# Patient Record
Sex: Female | Born: 1955 | Race: White | Hispanic: No | Smoking: Current every day smoker
Health system: Southern US, Community
[De-identification: ages and names within clinical notes are randomized; demographics above are authoritative.]

## PROBLEM LIST (undated history)

## (undated) DIAGNOSIS — G43909 Migraine, unspecified, not intractable, without status migrainosus: Secondary | ICD-10-CM

## (undated) DIAGNOSIS — I209 Angina pectoris, unspecified: Secondary | ICD-10-CM

## (undated) DIAGNOSIS — F32A Depression, unspecified: Secondary | ICD-10-CM

## (undated) DIAGNOSIS — F419 Anxiety disorder, unspecified: Secondary | ICD-10-CM

## (undated) DIAGNOSIS — E7800 Pure hypercholesterolemia, unspecified: Secondary | ICD-10-CM

## (undated) DIAGNOSIS — I10 Essential (primary) hypertension: Secondary | ICD-10-CM

## (undated) DIAGNOSIS — F329 Major depressive disorder, single episode, unspecified: Secondary | ICD-10-CM

## (undated) DIAGNOSIS — K219 Gastro-esophageal reflux disease without esophagitis: Secondary | ICD-10-CM

## (undated) DIAGNOSIS — F319 Bipolar disorder, unspecified: Secondary | ICD-10-CM

## (undated) HISTORY — PX: BACK SURGERY: SHX140

---

## 1989-01-22 HISTORY — PX: ANTERIOR CERVICAL DECOMP/DISCECTOMY FUSION: SHX1161

## 1990-05-24 HISTORY — PX: BUNIONECTOMY: SHX129

## 2001-04-19 ENCOUNTER — Other Ambulatory Visit: Admission: RE | Admit: 2001-04-19 | Discharge: 2001-04-19 | Payer: Self-pay | Admitting: Gynecology

## 2002-04-05 ENCOUNTER — Other Ambulatory Visit: Admission: RE | Admit: 2002-04-05 | Discharge: 2002-04-05 | Payer: Self-pay | Admitting: Gynecology

## 2003-04-22 ENCOUNTER — Other Ambulatory Visit: Admission: RE | Admit: 2003-04-22 | Discharge: 2003-04-22 | Payer: Self-pay | Admitting: Gynecology

## 2004-03-26 ENCOUNTER — Other Ambulatory Visit: Admission: RE | Admit: 2004-03-26 | Discharge: 2004-03-26 | Payer: Self-pay | Admitting: Gynecology

## 2005-05-20 ENCOUNTER — Other Ambulatory Visit: Admission: RE | Admit: 2005-05-20 | Discharge: 2005-05-20 | Payer: Self-pay | Admitting: Gynecology

## 2007-10-26 ENCOUNTER — Observation Stay (HOSPITAL_COMMUNITY): Admission: EM | Admit: 2007-10-26 | Discharge: 2007-10-27 | Payer: Self-pay | Admitting: Emergency Medicine

## 2007-11-07 HISTORY — PX: CARDIAC CATHETERIZATION: SHX172

## 2008-02-13 ENCOUNTER — Other Ambulatory Visit: Admission: RE | Admit: 2008-02-13 | Discharge: 2008-02-13 | Payer: Self-pay | Admitting: Gynecology

## 2008-04-08 ENCOUNTER — Encounter: Admission: RE | Admit: 2008-04-08 | Discharge: 2008-04-08 | Payer: Self-pay | Admitting: Family Medicine

## 2010-10-06 NOTE — H&P (Signed)
[name], [name] NO.:  [id number]   MEDICAL RECORD NO.:  [id number]          PATIENT TYPE:  EMS   LOCATION:  MAJO                         FACILITY:  [facility]   PHYSICIAN:  Elmore Guise., M.D.DATE OF BIRTH:  04-13-1956   DATE OF ADMISSION:  10/26/2007  DATE OF DISCHARGE:                              HISTORY & PHYSICAL   INDICATION FOR ADMISSION:  Chest pain.   PRIMARY CARE PHYSICIAN:  Dr. Ursula Beath, [facility].   HISTORY OF PRESENT ILLNESS:  [name] is a very pleasant 55 year old  white female with past medical history of tobacco use, strong family  history of early heart disease who presents with a 24-hour history of  off-and-on substernal chest pain.  Her initial symptoms started last  p.m. with severe substernal chest pressure. This was associated with  shortness of breath.  She does state it was worse with exertion.  She  denies any nausea or diaphoresis.  She did say she felt cold and clammy  though.  She got up, tried to walk around, and her symptoms worsened.  She laid down because her symptoms were so bad.  This lasted 5 minutes  and then slowly improved.  She then went to bed, awoke this morning with  recurrence of some chest tightness with exertion.  This was again  associated with shortness of breath.  She had no nausea or diaphoresis.  She has had decreased exertional tolerance the last couple of months. No  fever or cough, orthopnea, PND.  No nausea, vomiting, diarrhea. Her  weight is stable for her.  She has had no palpitations, pre-syncope or  syncope.   REVIEW OF SYSTEMS:  As per HPI or otherwise negative.   MEDICATIONS:  1. Celexa.  2. Aspirin.  3. Zyrtec.   ALLERGIES:  GUAIFENESIN AND PENICILLIN.   FAMILY HISTORY:  Is positive for heart disease with a brother having  stents  or a heart attack at age 25.   SOCIAL HISTORY:  She is married.  Smokes one pack per day.  Occasional  alcohol intake.  She  is afebrile.  Temperature is 98.3, blood pressure  140/80, heart rate 71, respirations are 20, O2 saturation is 98% on room  air.  GENERAL:  In general, she is a very pleasant white female, alert and  oriented x4.  No acute distress.  HEENT:  Is normal.  NECK: Supple.  No lymphadenopathy, 2+ carotids.  No JVD and no bruits.  LUNGS:  Clear.  HEART:  Heart is regular with no significant murmurs, gallops or rubs.  ABDOMEN: Soft, nontender, nontender, nondistended with no rebound or  guarding.  EXTREMITIES:  Warm with 2+ pulses and no significant edema.   Her blood work shows a BUN and creatinine of 10 and 0.9, potassium level  3.7, H&H 14.6  and 43.0. Her cardiac markers are negative with a  troponin less than 0.05, MB of less than 1.0.  Myoglobin of 24.  Her ECG  shows normal sinus rhythm with septal Q-waves, but no significant ST or  T-wave changes.  CT  of the chest showed mild aneurysmal dilatation of  the ascending aorta, otherwise normal.  Chest x-ray showed no pulmonary  edema or pneumonia. Cardiac silhouette showed very mild cardiomegaly.   IMPRESSION:  1. Chest pain.  2. Tobacco dependence.  3. Strong family history of heart disease.   PLAN:  At this time we will continue aspirin.  She is chest pain free  currently.  We will start beta blocker, metoprolol 12.5 mg twice daily  with p.r.n. nitroglycerin.  She will have serial cardiac markers as well  as fasting lipid panel.  She will have cardiac catheterization in the  morning.  This was discussed with her at length.      Elmore Guise., M.D.  Electronically Signed     TWK/MEDQ  D:  10/26/2007  T:  10/26/2007  Job:  [id number]

## 2010-10-09 NOTE — Discharge Summary (Signed)
[name], [name] NO.:  [id number]   MEDICAL RECORD NO.:  [id number]          PATIENT TYPE:  INP   LOCATION:  4729                         FACILITY:  [facility]   PHYSICIAN:  Elmore Guise., M.D.DATE OF BIRTH:  July 14, 1955   DATE OF ADMISSION:  10/26/2007  DATE OF DISCHARGE:  10/27/2007                               DISCHARGE SUMMARY   DISCHARGE DIAGNOSES:  1. Normal-appearing coronary arteries.  2. Tobacco dependence.  3. Seasonal allergies.  4. Anxiety/depression.   HISTORY OF PRESENT ILLNESS:  [name] is a very pleasant 55 year old  white female who presented with increasing exertional chest pain and  shortness of breath.  She was admitted for observation and follow up.  She was referred for cardiac catheterization because of her symptoms and  strong family history of early heart disease.   HOSPITAL COURSE:  The patient's hospital course was uncomplicated.  She  underwent cardiac catheterization on October 27, 2007.  She tolerated the  procedure well.  She had Angio-Seal closure device deployed without  difficulty.  Her postprocedure course was uncomplicated.  Her cast  showed that she had normal-appearing coronary arteries.  She has now  been up and ambulatory without any significant problems.  She will be  discharged home today to continue the following medications.  1. Celexa 40 mg daily.  2. Zyrtec 10 mg daily.  3. Fish oil once daily.  4. Vitamin C and E once daily.  5. Milk thistle once daily.  All as before.   She will have post cath restrictions including no heavy lifting or  strenuous activity for the next couple of days.  She is to increase her  activity slowly.  She is to call the office if she has any significant  swelling, drainage, warmth, or bleeding from her cath site.  Her  restrictions were discussed in detail.  I did discuss complete tobacco  cessation, and she understands the importance of this.  Her follow up  appointment will be  with Dr. Lady Deutscher at [facility] in 2  weeks.      Elmore Guise., M.D.  Electronically Signed     TWK/MEDQ  D:  10/30/2007  T:  10/31/2007  Job:  [id number]

## 2011-02-18 LAB — CARDIAC PANEL(CRET KIN+CKTOT+MB+TROPI)
CK, MB: 1.1
Total CK: 58
Troponin I: 0.02

## 2011-02-18 LAB — CBC
HCT: 41.4
HCT: 42
Hemoglobin: 14.1
MCHC: 34
MCHC: 35.1
MCV: 96.3
Platelets: 184
RBC: 4.25
WBC: 5.8
WBC: 6.2

## 2011-02-18 LAB — PROTIME-INR: INR: 0.9

## 2011-02-18 LAB — LIPID PANEL
Cholesterol: 184
HDL: 67
LDL Cholesterol: 107 — ABNORMAL HIGH
Triglycerides: 49

## 2011-02-18 LAB — DIFFERENTIAL
Basophils Absolute: 0
Basophils Relative: 1
Eosinophils Absolute: 0.2
Eosinophils Relative: 4
Lymphocytes Relative: 43
Lymphs Abs: 2.5
Monocytes Absolute: 0.7

## 2011-02-18 LAB — POCT I-STAT, CHEM 8
Calcium, Ion: 1.25
Hemoglobin: 14.6
TCO2: 28

## 2011-02-18 LAB — CK TOTAL AND CKMB (NOT AT ARMC)
CK, MB: 1.5
Total CK: 78

## 2011-02-18 LAB — POCT CARDIAC MARKERS: CKMB, poc: <1 — ABNORMAL LOW

## 2011-02-18 LAB — BASIC METABOLIC PANEL
Glucose, Bld: 99
Potassium: 3.7

## 2011-02-18 LAB — APTT: aPTT: 25

## 2011-04-14 ENCOUNTER — Other Ambulatory Visit: Payer: Self-pay | Admitting: Gynecology

## 2011-04-14 DIAGNOSIS — R928 Other abnormal and inconclusive findings on diagnostic imaging of breast: Secondary | ICD-10-CM

## 2011-04-19 ENCOUNTER — Ambulatory Visit
Admission: RE | Admit: 2011-04-19 | Discharge: 2011-04-19 | Disposition: A | Discharge status: Home / Self Care | Payer: 59 | Source: Ambulatory Visit | Attending: Gynecology | Admitting: Gynecology

## 2011-04-19 ENCOUNTER — Other Ambulatory Visit: Payer: Self-pay | Admitting: Gynecology

## 2011-04-19 DIAGNOSIS — R928 Other abnormal and inconclusive findings on diagnostic imaging of breast: Secondary | ICD-10-CM

## 2011-04-22 ENCOUNTER — Ambulatory Visit
Admission: RE | Admit: 2011-04-22 | Discharge: 2011-04-22 | Disposition: A | Discharge status: Home / Self Care | Payer: 59 | Source: Ambulatory Visit | Attending: Gynecology | Admitting: Gynecology

## 2011-04-22 ENCOUNTER — Other Ambulatory Visit: Payer: Self-pay | Admitting: Gynecology

## 2011-04-22 DIAGNOSIS — R928 Other abnormal and inconclusive findings on diagnostic imaging of breast: Secondary | ICD-10-CM

## 2011-07-01 ENCOUNTER — Ambulatory Visit (INDEPENDENT_AMBULATORY_CARE_PROVIDER_SITE_OTHER): Payer: 59 | Admitting: Licensed Clinical Social Worker

## 2011-07-01 DIAGNOSIS — F331 Major depressive disorder, recurrent, moderate: Secondary | ICD-10-CM

## 2011-07-01 DIAGNOSIS — F411 Generalized anxiety disorder: Secondary | ICD-10-CM

## 2011-07-08 ENCOUNTER — Ambulatory Visit (INDEPENDENT_AMBULATORY_CARE_PROVIDER_SITE_OTHER): Payer: 59 | Admitting: Licensed Clinical Social Worker

## 2011-07-08 DIAGNOSIS — F411 Generalized anxiety disorder: Secondary | ICD-10-CM

## 2011-07-08 DIAGNOSIS — F331 Major depressive disorder, recurrent, moderate: Secondary | ICD-10-CM

## 2011-07-15 ENCOUNTER — Ambulatory Visit (INDEPENDENT_AMBULATORY_CARE_PROVIDER_SITE_OTHER): Payer: 59 | Admitting: Licensed Clinical Social Worker

## 2011-07-15 DIAGNOSIS — F331 Major depressive disorder, recurrent, moderate: Secondary | ICD-10-CM

## 2011-07-15 DIAGNOSIS — F411 Generalized anxiety disorder: Secondary | ICD-10-CM

## 2011-07-23 ENCOUNTER — Ambulatory Visit: Payer: 59 | Admitting: Licensed Clinical Social Worker

## 2011-07-28 ENCOUNTER — Ambulatory Visit (INDEPENDENT_AMBULATORY_CARE_PROVIDER_SITE_OTHER): Payer: 59 | Admitting: Licensed Clinical Social Worker

## 2011-07-28 DIAGNOSIS — F331 Major depressive disorder, recurrent, moderate: Secondary | ICD-10-CM

## 2011-07-28 DIAGNOSIS — F411 Generalized anxiety disorder: Secondary | ICD-10-CM

## 2011-08-06 ENCOUNTER — Ambulatory Visit (INDEPENDENT_AMBULATORY_CARE_PROVIDER_SITE_OTHER): Payer: 59 | Admitting: Licensed Clinical Social Worker

## 2011-08-06 DIAGNOSIS — F411 Generalized anxiety disorder: Secondary | ICD-10-CM

## 2011-08-06 DIAGNOSIS — F331 Major depressive disorder, recurrent, moderate: Secondary | ICD-10-CM

## 2011-08-13 ENCOUNTER — Ambulatory Visit (INDEPENDENT_AMBULATORY_CARE_PROVIDER_SITE_OTHER): Payer: 59 | Admitting: Licensed Clinical Social Worker

## 2011-08-13 DIAGNOSIS — F411 Generalized anxiety disorder: Secondary | ICD-10-CM

## 2011-08-13 DIAGNOSIS — F331 Major depressive disorder, recurrent, moderate: Secondary | ICD-10-CM

## 2011-08-19 ENCOUNTER — Ambulatory Visit: Payer: 59 | Admitting: Licensed Clinical Social Worker

## 2011-08-26 ENCOUNTER — Ambulatory Visit (INDEPENDENT_AMBULATORY_CARE_PROVIDER_SITE_OTHER): Payer: 59 | Admitting: Licensed Clinical Social Worker

## 2011-08-26 DIAGNOSIS — F411 Generalized anxiety disorder: Secondary | ICD-10-CM

## 2011-08-26 DIAGNOSIS — F331 Major depressive disorder, recurrent, moderate: Secondary | ICD-10-CM

## 2011-09-09 ENCOUNTER — Ambulatory Visit (INDEPENDENT_AMBULATORY_CARE_PROVIDER_SITE_OTHER): Payer: 59 | Admitting: Licensed Clinical Social Worker

## 2011-09-09 DIAGNOSIS — F331 Major depressive disorder, recurrent, moderate: Secondary | ICD-10-CM

## 2011-09-09 DIAGNOSIS — F411 Generalized anxiety disorder: Secondary | ICD-10-CM

## 2011-09-16 ENCOUNTER — Ambulatory Visit (INDEPENDENT_AMBULATORY_CARE_PROVIDER_SITE_OTHER): Payer: 59 | Admitting: Licensed Clinical Social Worker

## 2011-09-16 DIAGNOSIS — F331 Major depressive disorder, recurrent, moderate: Secondary | ICD-10-CM

## 2011-09-16 DIAGNOSIS — F411 Generalized anxiety disorder: Secondary | ICD-10-CM

## 2011-09-17 ENCOUNTER — Ambulatory Visit: Payer: 59 | Admitting: Licensed Clinical Social Worker

## 2011-09-24 ENCOUNTER — Ambulatory Visit (INDEPENDENT_AMBULATORY_CARE_PROVIDER_SITE_OTHER): Payer: 59 | Admitting: Licensed Clinical Social Worker

## 2011-09-24 DIAGNOSIS — F411 Generalized anxiety disorder: Secondary | ICD-10-CM

## 2011-09-24 DIAGNOSIS — F331 Major depressive disorder, recurrent, moderate: Secondary | ICD-10-CM

## 2011-10-01 ENCOUNTER — Ambulatory Visit (INDEPENDENT_AMBULATORY_CARE_PROVIDER_SITE_OTHER): Payer: 59 | Admitting: Licensed Clinical Social Worker

## 2011-10-01 DIAGNOSIS — F331 Major depressive disorder, recurrent, moderate: Secondary | ICD-10-CM

## 2011-10-01 DIAGNOSIS — F411 Generalized anxiety disorder: Secondary | ICD-10-CM

## 2011-10-08 ENCOUNTER — Ambulatory Visit: Payer: 59 | Admitting: Licensed Clinical Social Worker

## 2011-10-29 ENCOUNTER — Ambulatory Visit: Payer: 59 | Admitting: Licensed Clinical Social Worker

## 2011-11-24 ENCOUNTER — Ambulatory Visit (INDEPENDENT_AMBULATORY_CARE_PROVIDER_SITE_OTHER): Payer: 59 | Admitting: Licensed Clinical Social Worker

## 2011-11-24 DIAGNOSIS — F411 Generalized anxiety disorder: Secondary | ICD-10-CM

## 2011-11-24 DIAGNOSIS — F331 Major depressive disorder, recurrent, moderate: Secondary | ICD-10-CM

## 2011-12-08 ENCOUNTER — Ambulatory Visit (INDEPENDENT_AMBULATORY_CARE_PROVIDER_SITE_OTHER): Payer: 59 | Admitting: Licensed Clinical Social Worker

## 2011-12-08 DIAGNOSIS — F411 Generalized anxiety disorder: Secondary | ICD-10-CM

## 2011-12-08 DIAGNOSIS — F331 Major depressive disorder, recurrent, moderate: Secondary | ICD-10-CM

## 2011-12-22 ENCOUNTER — Ambulatory Visit (INDEPENDENT_AMBULATORY_CARE_PROVIDER_SITE_OTHER): Payer: 59 | Admitting: Licensed Clinical Social Worker

## 2011-12-22 DIAGNOSIS — F331 Major depressive disorder, recurrent, moderate: Secondary | ICD-10-CM

## 2011-12-22 DIAGNOSIS — F411 Generalized anxiety disorder: Secondary | ICD-10-CM

## 2011-12-29 ENCOUNTER — Ambulatory Visit (INDEPENDENT_AMBULATORY_CARE_PROVIDER_SITE_OTHER): Payer: 59 | Admitting: Licensed Clinical Social Worker

## 2011-12-29 DIAGNOSIS — F331 Major depressive disorder, recurrent, moderate: Secondary | ICD-10-CM

## 2011-12-29 DIAGNOSIS — F411 Generalized anxiety disorder: Secondary | ICD-10-CM

## 2012-01-11 ENCOUNTER — Emergency Department (HOSPITAL_COMMUNITY)
Admission: EM | Admit: 2012-01-11 | Discharge: 2012-01-12 | Disposition: A | Discharge status: Home / Self Care | Payer: 59 | Attending: Emergency Medicine | Admitting: Emergency Medicine

## 2012-01-11 ENCOUNTER — Encounter (HOSPITAL_COMMUNITY): Payer: Self-pay | Admitting: Emergency Medicine

## 2012-01-11 DIAGNOSIS — F17200 Nicotine dependence, unspecified, uncomplicated: Secondary | ICD-10-CM | POA: Insufficient documentation

## 2012-01-11 DIAGNOSIS — I10 Essential (primary) hypertension: Secondary | ICD-10-CM | POA: Insufficient documentation

## 2012-01-11 DIAGNOSIS — R55 Syncope and collapse: Secondary | ICD-10-CM | POA: Insufficient documentation

## 2012-01-11 HISTORY — DX: Essential (primary) hypertension: I10

## 2012-01-11 HISTORY — DX: Depression, unspecified: F32.A

## 2012-01-11 HISTORY — DX: Major depressive disorder, single episode, unspecified: F32.9

## 2012-01-11 MED ORDER — SODIUM CHLORIDE 0.9 % IV SOLN
Freq: Once | INTRAVENOUS | Status: AC
  Administered 2012-01-11: via INTRAVENOUS

## 2012-01-11 NOTE — ED Notes (Addendum)
Patient states "I just don't feel well. I feel like I am going to pass out. I took my blood pressure at home and it was 87/63." Admits to drinking 3 beers today. Patient states she is on a new medication for depression.

## 2012-01-11 NOTE — ED Notes (Addendum)
Pt reporting that previously tonight she became lightheaded and dizzy.  Denies actually losing consciousness, but felt like she could pass out.  Pt reporting that she began taking Lamictal a few days ago.  Pt also reporting drinking "a couple" beers today.

## 2012-01-11 NOTE — ED Provider Notes (Signed)
History  This chart was scribed for [employer]. Colon Branch, MD by Bennett Scrape. This patient was seen in room APA06/APA06 and the patient's care was started at 10:57PM.  CSN: [id number]  Arrival date & time 01/11/12  2123   First MD Initiated Contact with Patient 01/11/12 2257      Chief Complaint  Patient presents with  . Dizziness    The history is provided by the patient. No language interpreter was used.    [name] is a 56 y.o. female who presents to the Emergency Department complaining of one episode of near syncope with associated dizziness that occurred approximately 2 hours ago. She states that she still feels dizzy currently. The dizziness is worse with sitting and standing. She denies any recent illnesses but reports that her PCP recently started her on 25 mg of Lamictal and cut her Celexa prescription from 40 mg to 20 mg per day. She states that she has felt "itchy" since starting the Xanax. She denies having prior episodes of similar symptoms. She also c/o HA but denies fever, sore throat, visual disturbance, CP, SOB, abdominal pain, urinary symptoms, back pain, weakness and rash as associated symptoms. She reports that she has been eating and drinking normally.  Dr. Arlyce Dice at [facility] is her PCP. Dr. Clois Comber is her psychiatrist   Past Medical History  Diagnosis Date  . Depression   . Hypertension     Past Surgical History  Procedure Date  . Cervical discectomy   . Bunion removal     No family history on file.  History  Substance Use Topics  . Smoking status: Current Everyday Smoker  . Smokeless tobacco: Not on file  . Alcohol Use: Yes     daily    No OB history provided.  Review of Systems  Constitutional: Negative for fever.       10 Systems reviewed and are negative for acute change except as noted in the HPI.  HENT: Negative for congestion.   Eyes: Negative for discharge and redness.  Respiratory: Negative for cough and shortness of breath.     Cardiovascular: Negative for chest pain.  Gastrointestinal: Negative for vomiting and abdominal pain.  Musculoskeletal: Negative for back pain.  Skin: Negative for rash.  Neurological: Positive for light-headedness. Negative for syncope, numbness and headaches.  Psychiatric/Behavioral:       No behavior change.      Allergies  Guaifenesin & derivatives  Home Medications   Current Outpatient Rx  Name Route Sig Dispense Refill  . ALPRAZOLAM 0.5 MG PO TABS Oral Take 0.5 mg by mouth at bedtime. PRESCRIBED: take one-half to one tablet by mouth three times daily as needed    . CITALOPRAM HYDROBROMIDE 40 MG PO TABS Oral Take 40 mg by mouth daily.    . OMEGA-3 FATTY ACIDS 1000 MG PO CAPS Oral Take 1 g by mouth daily.    Marland Kitchen LAMOTRIGINE 25 MG PO TABS Oral Take 25 mg by mouth as directed. Take one tablet by mouth at bedtime for 2 weeks, then take two tablets at bedtime thereafter    . OLMESARTAN MEDOXOMIL-HCTZ 20-12.5 MG PO TABS Oral Take 1 tablet by mouth daily.      Triage Vitals: BP 117/71  Pulse 92  Temp 98.3 F (36.8 C) (Oral)  Resp 16  Ht 5\' 3"  (1.6 m)  Wt 114 lb (51.71 kg)  BMI 20.19 kg/m2  SpO2 97%  Physical Exam  Nursing note and vitals reviewed. Constitutional: She is oriented  to person, place, and time. She appears well-developed and well-nourished. No distress.  HENT:  Head: Normocephalic and atraumatic.       Subjective dizziness with sitting up  Eyes: Conjunctivae and EOM are normal.  Neck: Neck supple. No tracheal deviation present.  Cardiovascular: Normal rate and regular rhythm.  Exam reveals no gallop and no friction rub.   No murmur heard. Pulmonary/Chest: Effort normal and breath sounds normal. No respiratory distress. She has no wheezes. She has no rales.  Abdominal: Soft. Bowel sounds are normal. There is no tenderness.  Musculoskeletal: Normal range of motion. She exhibits no edema.  Neurological: She is alert and oriented to person, place, and time.   Skin: Skin is warm and dry.  Psychiatric: She has a normal mood and affect. Her behavior is normal.    ED Course  Procedures (including critical care time)  DIAGNOSTIC STUDIES: Oxygen Saturation is 97% on room air, adequate by my interpretation.    COORDINATION OF CARE: 11:30PM-Discussed treatment plan which includes IV fluids and blood work with pt at bedside and pt agreed to plan.  MDM   Patient with near syncopal episode with position change. Orthostatic VS normal. Given IVF, ambulated in the hallway. No further sensation of light headedness.Pt stable in ED with no significant deterioration in condition.The patient appears reasonably screened and/or stabilized for discharge and I doubt any other medical condition or other [facility] requiring further screening, evaluation, or treatment in the ED at this time prior to discharge.  I personally performed the services described in this documentation, which was scribed in my presence. The recorded information has been reviewed and considered.   MDM Reviewed: nursing note and vitals          Nicoletta Dress. Colon Branch, MD 01/12/12 1610

## 2012-01-12 ENCOUNTER — Other Ambulatory Visit: Payer: Self-pay | Admitting: Physician Assistant

## 2012-01-12 LAB — BASIC METABOLIC PANEL
CO2: 29 mEq/L (ref 19–32)
Chloride: 99 mEq/L (ref 96–112)
Glucose, Bld: 112 mg/dL — ABNORMAL HIGH (ref 70–99)
Potassium: 3.6 mEq/L (ref 3.5–5.1)
Sodium: 139 mEq/L (ref 135–145)

## 2012-01-12 MED ORDER — IBUPROFEN 400 MG PO TABS
600.0000 mg | ORAL_TABLET | Freq: Once | ORAL | Status: AC
  Administered 2012-01-12: 600 mg via ORAL
  Filled 2012-01-12: qty 2

## 2012-01-12 MED ORDER — SODIUM CHLORIDE 0.9 % IV BOLUS (SEPSIS): 1000.0000 mL | Freq: Once | INTRAVENOUS | Status: DC

## 2012-01-12 NOTE — ED Notes (Signed)
Pt reporting relief of headache.  Ambulated around nurses' station with no difficulty, dizziness or SOB.

## 2012-01-13 ENCOUNTER — Ambulatory Visit
Admission: RE | Admit: 2012-01-13 | Discharge: 2012-01-13 | Disposition: A | Discharge status: Home / Self Care | Payer: 59 | Source: Ambulatory Visit | Attending: Physician Assistant | Admitting: Physician Assistant

## 2012-01-14 ENCOUNTER — Ambulatory Visit: Payer: 59 | Admitting: Licensed Clinical Social Worker

## 2012-01-20 ENCOUNTER — Ambulatory Visit: Payer: Self-pay | Admitting: Licensed Clinical Social Worker

## 2012-06-30 ENCOUNTER — Ambulatory Visit: Payer: 59 | Admitting: Licensed Clinical Social Worker

## 2013-02-22 ENCOUNTER — Other Ambulatory Visit: Payer: Self-pay | Admitting: Otolaryngology

## 2013-02-22 DIAGNOSIS — R42 Dizziness and giddiness: Secondary | ICD-10-CM

## 2013-02-22 DIAGNOSIS — H905 Unspecified sensorineural hearing loss: Secondary | ICD-10-CM

## 2013-02-26 ENCOUNTER — Other Ambulatory Visit: Payer: 59

## 2013-03-06 ENCOUNTER — Encounter (HOSPITAL_COMMUNITY): Payer: Self-pay | Admitting: Emergency Medicine

## 2013-03-06 ENCOUNTER — Emergency Department (HOSPITAL_COMMUNITY): Payer: 59

## 2013-03-06 ENCOUNTER — Emergency Department (HOSPITAL_COMMUNITY)
Admission: EM | Admit: 2013-03-06 | Discharge: 2013-03-06 | Disposition: A | Discharge status: Home / Self Care | Payer: 59 | Attending: Emergency Medicine | Admitting: Emergency Medicine

## 2013-03-06 DIAGNOSIS — J029 Acute pharyngitis, unspecified: Secondary | ICD-10-CM | POA: Insufficient documentation

## 2013-03-06 DIAGNOSIS — F319 Bipolar disorder, unspecified: Secondary | ICD-10-CM | POA: Insufficient documentation

## 2013-03-06 DIAGNOSIS — E876 Hypokalemia: Secondary | ICD-10-CM | POA: Insufficient documentation

## 2013-03-06 DIAGNOSIS — R0602 Shortness of breath: Secondary | ICD-10-CM | POA: Insufficient documentation

## 2013-03-06 DIAGNOSIS — Z888 Allergy status to other drugs, medicaments and biological substances status: Secondary | ICD-10-CM | POA: Insufficient documentation

## 2013-03-06 DIAGNOSIS — R05 Cough: Secondary | ICD-10-CM | POA: Insufficient documentation

## 2013-03-06 DIAGNOSIS — Z79899 Other long term (current) drug therapy: Secondary | ICD-10-CM | POA: Insufficient documentation

## 2013-03-06 DIAGNOSIS — R51 Headache: Secondary | ICD-10-CM | POA: Insufficient documentation

## 2013-03-06 DIAGNOSIS — I10 Essential (primary) hypertension: Secondary | ICD-10-CM | POA: Insufficient documentation

## 2013-03-06 DIAGNOSIS — F17200 Nicotine dependence, unspecified, uncomplicated: Secondary | ICD-10-CM | POA: Insufficient documentation

## 2013-03-06 DIAGNOSIS — J209 Acute bronchitis, unspecified: Secondary | ICD-10-CM | POA: Insufficient documentation

## 2013-03-06 DIAGNOSIS — H53149 Visual discomfort, unspecified: Secondary | ICD-10-CM | POA: Insufficient documentation

## 2013-03-06 DIAGNOSIS — J40 Bronchitis, not specified as acute or chronic: Secondary | ICD-10-CM

## 2013-03-06 DIAGNOSIS — R059 Cough, unspecified: Secondary | ICD-10-CM | POA: Insufficient documentation

## 2013-03-06 HISTORY — DX: Bipolar disorder, unspecified: F31.9

## 2013-03-06 LAB — CBC WITH DIFFERENTIAL/PLATELET
Eosinophils Absolute: 0.1 10*3/uL (ref 0.0–0.7)
Eosinophils Relative: 1 % (ref 0–5)
HCT: 40.6 % (ref 36.0–46.0)
Hemoglobin: 13.6 g/dL (ref 12.0–15.0)
Lymphocytes Relative: 30 % (ref 12–46)
Lymphs Abs: 3.2 10*3/uL (ref 0.7–4.0)
MCH: 32.5 pg (ref 26.0–34.0)
MCV: 96.9 fL (ref 78.0–100.0)
Monocytes Relative: 12 % (ref 3–12)
RBC: 4.19 MIL/uL (ref 3.87–5.11)
WBC: 10.5 10*3/uL (ref 4.0–10.5)

## 2013-03-06 LAB — URINALYSIS, ROUTINE W REFLEX MICROSCOPIC
Bilirubin Urine: NEGATIVE
Glucose, UA: NEGATIVE mg/dL
Hgb urine dipstick: NEGATIVE
Protein, ur: NEGATIVE mg/dL
Specific Gravity, Urine: 1.013 (ref 1.005–1.030)
Urobilinogen, UA: 0.2 mg/dL (ref 0.0–1.0)

## 2013-03-06 LAB — BASIC METABOLIC PANEL
BUN: 14 mg/dL (ref 6–23)
CO2: 30 mEq/L (ref 19–32)
Calcium: 9.6 mg/dL (ref 8.4–10.5)
GFR calc non Af Amer: >90 mL/min (ref >90)
Glucose, Bld: 93 mg/dL (ref 70–99)

## 2013-03-06 LAB — GLUCOSE, CAPILLARY

## 2013-03-06 MED ORDER — POTASSIUM CHLORIDE 10 MEQ/100ML IV SOLN
10.0000 meq | Freq: Once | INTRAVENOUS | Status: AC
  Administered 2013-03-06: 10 meq via INTRAVENOUS
  Filled 2013-03-06: qty 100

## 2013-03-06 MED ORDER — DEXAMETHASONE SODIUM PHOSPHATE 10 MG/ML IJ SOLN
10.0000 mg | Freq: Once | INTRAMUSCULAR | Status: AC
  Administered 2013-03-06: 10 mg via INTRAVENOUS
  Filled 2013-03-06: qty 1

## 2013-03-06 MED ORDER — AZITHROMYCIN 250 MG PO TABS: 250.0000 mg | ORAL_TABLET | Freq: Every day | ORAL | Status: DC

## 2013-03-06 MED ORDER — KETOROLAC TROMETHAMINE 30 MG/ML IJ SOLN
30.0000 mg | Freq: Once | INTRAMUSCULAR | Status: AC
  Administered 2013-03-06: 30 mg via INTRAVENOUS
  Filled 2013-03-06: qty 1

## 2013-03-06 MED ORDER — METOCLOPRAMIDE HCL 5 MG/ML IJ SOLN
10.0000 mg | Freq: Once | INTRAMUSCULAR | Status: AC
  Administered 2013-03-06: 10 mg via INTRAVENOUS
  Filled 2013-03-06: qty 2

## 2013-03-06 MED ORDER — DIPHENHYDRAMINE HCL 50 MG/ML IJ SOLN
25.0000 mg | Freq: Once | INTRAMUSCULAR | Status: AC
  Administered 2013-03-06: 25 mg via INTRAVENOUS
  Filled 2013-03-06: qty 1

## 2013-03-06 MED ORDER — TRAMADOL HCL 50 MG PO TABS: 50.0000 mg | ORAL_TABLET | Freq: Four times a day (QID) | ORAL | Status: AC | PRN

## 2013-03-06 MED ORDER — POTASSIUM CHLORIDE CRYS ER 20 MEQ PO TBCR
40.0000 meq | EXTENDED_RELEASE_TABLET | Freq: Once | ORAL | Status: AC
  Administered 2013-03-06: 40 meq via ORAL
  Filled 2013-03-06: qty 2

## 2013-03-06 MED ORDER — SODIUM CHLORIDE 0.9 % IV BOLUS (SEPSIS)
1000.0000 mL | Freq: Once | INTRAVENOUS | Status: AC
  Administered 2013-03-06: 1000 mL via INTRAVENOUS

## 2013-03-06 NOTE — ED Notes (Signed)
Pt has had vertigo x 2 weeks and placed on meds for it and has had a h/a x 1 week also has sore throat and cough x 1 day

## 2013-03-06 NOTE — ED Notes (Signed)
Patient transported to CT 

## 2013-03-06 NOTE — ED Provider Notes (Signed)
CSN: [id number]     Arrival date & time 03/06/13  1101 History   First MD Initiated Contact with Patient 03/06/13 1104     Chief Complaint  Patient presents with  . Dizziness  . Headache   (Consider location/radiation/quality/duration/timing/severity/associated sxs/prior Treatment) HPI Comments: Patient is a 57 year old female with a past medical history of bipolar disorder and hypertension who presents with a headache for 1 week. Patient reports a gradual onset and progressive worsening of the headache. The pain is sharp, constant and is located in generalized head without radiation. Patient has tried nothing for symptoms without relief. No alleviating/aggravating factors. Patient reports associated cough and sore throat. Patient denies fever, vomiting, diarrhea, numbness/tingling, weakness, visual changes, congestion, chest pain, SOB, abdominal pain.      Past Medical History  Diagnosis Date  . Depression   . Hypertension   . Bipolar 1 disorder    Past Surgical History  Procedure Laterality Date  . Cervical discectomy    . Bunion removal     No family history on file. History  Substance Use Topics  . Smoking status: Current Every Day Smoker  . Smokeless tobacco: Not on file  . Alcohol Use: Yes     Comment: daily   OB History   Grav Para Term Preterm Abortions TAB SAB Ect Mult Living                 Review of Systems  HENT: Positive for sore throat.   Respiratory: Positive for cough.   Neurological: Positive for headaches.  All other systems reviewed and are negative.    Allergies  Guaifenesin & derivatives  Home Medications   Current Outpatient Rx  Name  Route  Sig  Dispense  Refill  . ALPRAZolam (XANAX) 0.5 MG tablet   Oral   Take 0.5 mg by mouth at bedtime. PRESCRIBED: take one-half to one tablet by mouth three times daily as needed         . citalopram (CELEXA) 40 MG tablet   Oral   Take 40 mg by mouth daily.         . fish oil-omega-3 fatty  acids 1000 MG capsule   Oral   Take 1 g by mouth daily.         Marland Kitchen lamoTRIgine (LAMICTAL) 25 MG tablet   Oral   Take 25 mg by mouth as directed. Take one tablet by mouth at bedtime for 2 weeks, then take two tablets at bedtime thereafter         . olmesartan-hydrochlorothiazide (BENICAR HCT) 20-12.5 MG per tablet   Oral   Take 1 tablet by mouth daily.          BP 148/87  Pulse 66  Temp(Src) 99.2 F (37.3 C) (Oral)  SpO2 96% Physical Exam  Nursing note and vitals reviewed. Constitutional: She is oriented to person, place, and time. She appears well-developed and well-nourished. No distress.  HENT:  Head: Normocephalic and atraumatic.  Mouth/Throat: Oropharynx is clear and moist. No oropharyngeal exudate.  Eyes: Conjunctivae and EOM are normal. Pupils are equal, round, and reactive to light.  Neck: Normal range of motion.  No meningeal signs.   Cardiovascular: Normal rate and regular rhythm.  Exam reveals no gallop and no friction rub.   No murmur heard. Pulmonary/Chest: Effort normal and breath sounds normal. She has no wheezes. She has no rales. She exhibits no tenderness.  Abdominal: Soft. She exhibits no distension. There is no tenderness. There is  no rebound and no guarding.  Musculoskeletal: Normal range of motion.  Neurological: She is alert and oriented to person, place, and time. No cranial nerve deficit. Coordination normal.  Speech is goal-oriented. Moves limbs without ataxia.   Skin: Skin is warm and dry.  Psychiatric: She has a normal mood and affect. Her behavior is normal.    ED Course  Procedures (including critical care time) Labs Review Labs Reviewed  CBC WITH DIFFERENTIAL - Abnormal; Notable for the following:    Monocytes Absolute 1.2 (*)    All other components within normal limits  BASIC METABOLIC PANEL - Abnormal; Notable for the following:    Potassium 3.0 (*)    All other components within normal limits  URINE CULTURE  URINALYSIS, ROUTINE W  REFLEX MICROSCOPIC  GLUCOSE, CAPILLARY   Imaging Review Dg Chest 2 View  03/06/2013   CLINICAL DATA:  Shortness of breath. Dry cough.  EXAM: CHEST  2 VIEW  COMPARISON:  03/28/2008  FINDINGS: Small sclerotic lesion in the right anterior 6th rib appears benign and was proven to be in this location on prior chest CT.  Airway thickening is present, suggesting bronchitis or reactive airways disease. Heart size within normal limits for technique.  No pleural effusion identified. Prominent ascending thoracic aorta, as before.  IMPRESSION: 1. Airway thickening is present, suggesting bronchitis or reactive airways disease. 2. Continued prominence of the ascending thoracic aorta.   Electronically Signed   By: Herbie Baltimore M.D.   On: 03/06/2013 13:05   Ct Head Wo Contrast  03/06/2013   CLINICAL DATA:  Severe persistent headache with photophobia  EXAM: CT HEAD WITHOUT CONTRAST  TECHNIQUE: Contiguous axial images were obtained from the base of the skull through the vertex without intravenous contrast. Study was obtained within 24 hr of patient's arrival at the emergency department.  COMPARISON:  January 13, 2012  FINDINGS: The ventricles are normal in size and configuration. There is no mass, hemorrhage, extra-axial fluid collection, or midline shift. No focal gray-white compartment lesions are identified. There is no demonstrable acute infarct. Bony calvarium appears intact. The mastoid air cells are clear.  IMPRESSION: Study within normal limits and stable.   Electronically Signed   By: Bretta Bang M.D.   On: 03/06/2013 13:17    EKG Interpretation   None       MDM   1. Headache   2. Hypokalemia   3. Bronchitis     11:49 AM Vitals stable and patient afebrile. Labs and CT head as well as chest xray pending. Vitals stable and patient afebrile.   3:05 PM CT head unremarkable. Chest xray shows bronchitis. Labs unremarkable for acute changes. Patient will be discharged without further  evaluation. No meningeal signs. Patient given recommended Neurologist for further evaluation. Vitals stable and patient afebrile. Patient will be discharged with Azithromycin for bronchitis. Patient instructed to return with worsening or concerning symptoms.    Emilia Beck, [place] 03/07/13 [phone]

## 2013-03-08 LAB — URINE CULTURE: Colony Count: 15000

## 2013-03-09 NOTE — ED Provider Notes (Signed)
Medical screening examination/treatment/procedure(s) were performed by non-physician practitioner and as supervising physician I was immediately available for consultation/collaboration.   Laray Anger, DO 03/09/13 1317

## 2013-03-09 NOTE — Progress Notes (Signed)
Post ED Visit - Positive Culture Follow-up  Culture report reviewed by antimicrobial stewardship pharmacist: []  Wes Dulaney, Pharm.D., BCPS []  Celedonio Miyamoto, Pharm.D., BCPS []  Georgina Pillion, Pharm.D., BCPS []  [place], [place].D., BCPS, AAHIVP [x]  Estella Husk, Pharm.D., BCPS, AAHIVP  Positive Urine culture No diagnosis of UTI, no symptoms of UTI.  No treatment needed.  Estella Husk, Pharm.D., BCPS, AAHIVP Clinical Pharmacist Phone: [phone] or [phone] Pager: [phone] 03/09/2013, 11:37 AM

## 2014-06-25 ENCOUNTER — Other Ambulatory Visit: Payer: Self-pay | Admitting: Family Medicine

## 2014-06-25 DIAGNOSIS — R42 Dizziness and giddiness: Secondary | ICD-10-CM

## 2014-06-25 DIAGNOSIS — R51 Headache: Secondary | ICD-10-CM

## 2014-06-25 DIAGNOSIS — R519 Headache, unspecified: Secondary | ICD-10-CM

## 2014-07-01 ENCOUNTER — Ambulatory Visit
Admission: RE | Admit: 2014-07-01 | Discharge: 2014-07-01 | Disposition: A | Discharge status: Home / Self Care | Payer: 59 | Source: Ambulatory Visit | Attending: Family Medicine | Admitting: Family Medicine

## 2014-07-01 DIAGNOSIS — R42 Dizziness and giddiness: Secondary | ICD-10-CM

## 2014-07-01 DIAGNOSIS — R51 Headache: Secondary | ICD-10-CM

## 2014-07-01 DIAGNOSIS — R519 Headache, unspecified: Secondary | ICD-10-CM

## 2014-07-11 ENCOUNTER — Encounter: Payer: Self-pay | Admitting: *Deleted

## 2014-07-18 ENCOUNTER — Ambulatory Visit: Payer: Self-pay | Admitting: Neurology

## 2014-09-04 ENCOUNTER — Ambulatory Visit: Payer: 59 | Admitting: Neurology

## 2015-01-08 ENCOUNTER — Other Ambulatory Visit: Payer: Self-pay | Admitting: Gynecology

## 2015-01-09 LAB — CYTOLOGY - PAP

## 2015-02-12 ENCOUNTER — Encounter (HOSPITAL_COMMUNITY): Payer: Self-pay | Admitting: *Deleted

## 2015-02-12 ENCOUNTER — Emergency Department (HOSPITAL_COMMUNITY): Payer: BLUE CROSS/BLUE SHIELD

## 2015-02-12 ENCOUNTER — Observation Stay (HOSPITAL_COMMUNITY)
Admission: EM | Admit: 2015-02-12 | Discharge: 2015-02-13 | Disposition: A | Discharge status: Home / Self Care | Payer: BLUE CROSS/BLUE SHIELD | Attending: Family Medicine | Admitting: Family Medicine

## 2015-02-12 ENCOUNTER — Inpatient Hospital Stay (HOSPITAL_COMMUNITY): Payer: BLUE CROSS/BLUE SHIELD

## 2015-02-12 DIAGNOSIS — R4781 Slurred speech: Secondary | ICD-10-CM | POA: Diagnosis not present

## 2015-02-12 DIAGNOSIS — Z720 Tobacco use: Secondary | ICD-10-CM | POA: Insufficient documentation

## 2015-02-12 DIAGNOSIS — I209 Angina pectoris, unspecified: Secondary | ICD-10-CM | POA: Diagnosis not present

## 2015-02-12 DIAGNOSIS — K219 Gastro-esophageal reflux disease without esophagitis: Secondary | ICD-10-CM | POA: Insufficient documentation

## 2015-02-12 DIAGNOSIS — F319 Bipolar disorder, unspecified: Secondary | ICD-10-CM | POA: Diagnosis not present

## 2015-02-12 DIAGNOSIS — G43909 Migraine, unspecified, not intractable, without status migrainosus: Secondary | ICD-10-CM | POA: Insufficient documentation

## 2015-02-12 DIAGNOSIS — Z79899 Other long term (current) drug therapy: Secondary | ICD-10-CM | POA: Insufficient documentation

## 2015-02-12 DIAGNOSIS — R531 Weakness: Secondary | ICD-10-CM | POA: Insufficient documentation

## 2015-02-12 DIAGNOSIS — I10 Essential (primary) hypertension: Secondary | ICD-10-CM | POA: Diagnosis not present

## 2015-02-12 DIAGNOSIS — R51 Headache: Secondary | ICD-10-CM

## 2015-02-12 DIAGNOSIS — E780 Pure hypercholesterolemia: Secondary | ICD-10-CM | POA: Insufficient documentation

## 2015-02-12 DIAGNOSIS — R079 Chest pain, unspecified: Secondary | ICD-10-CM | POA: Diagnosis present

## 2015-02-12 DIAGNOSIS — G43109 Migraine with aura, not intractable, without status migrainosus: Secondary | ICD-10-CM | POA: Insufficient documentation

## 2015-02-12 DIAGNOSIS — Z23 Encounter for immunization: Secondary | ICD-10-CM | POA: Insufficient documentation

## 2015-02-12 DIAGNOSIS — M79606 Pain in leg, unspecified: Secondary | ICD-10-CM

## 2015-02-12 DIAGNOSIS — R519 Headache, unspecified: Secondary | ICD-10-CM | POA: Insufficient documentation

## 2015-02-12 HISTORY — DX: Gastro-esophageal reflux disease without esophagitis: K21.9

## 2015-02-12 HISTORY — DX: Angina pectoris, unspecified: I20.9

## 2015-02-12 HISTORY — DX: Pure hypercholesterolemia, unspecified: E78.00

## 2015-02-12 HISTORY — DX: Migraine, unspecified, not intractable, without status migrainosus: G43.909

## 2015-02-12 HISTORY — DX: Anxiety disorder, unspecified: F41.9

## 2015-02-12 LAB — CBC
HCT: 40 % (ref 36.0–46.0)
HEMATOCRIT: 42.6 % (ref 36.0–46.0)
HEMOGLOBIN: 13.8 g/dL (ref 12.0–15.0)
Hemoglobin: 14.4 g/dL (ref 12.0–15.0)
MCH: 31.8 pg (ref 26.0–34.0)
MCH: 32.6 pg (ref 26.0–34.0)
MCHC: 33.8 g/dL (ref 30.0–36.0)
MCHC: 34.5 g/dL (ref 30.0–36.0)
MCV: 94 fL (ref 78.0–100.0)
MCV: 94.6 fL (ref 78.0–100.0)
PLATELETS: 243 10*3/uL (ref 150–400)
Platelets: 226 10*3/uL (ref 150–400)
RBC: 4.53 MIL/uL (ref 3.87–5.11)
RBC: 4.23 MIL/uL (ref 3.87–5.11)
RDW: 12.5 % (ref 11.5–15.5)
RDW: 12.5 % (ref 11.5–15.5)
WBC: 7.7 10*3/uL (ref 4.0–10.5)
WBC: 5.8 10*3/uL (ref 4.0–10.5)

## 2015-02-12 LAB — I-STAT TROPONIN, ED: Troponin i, poc: 0 ng/mL (ref 0.00–0.08)

## 2015-02-12 LAB — BASIC METABOLIC PANEL
Anion gap: 9 (ref 5–15)
BUN: 12 mg/dL (ref 6–20)
CHLORIDE: 103 mmol/L (ref 101–111)
CO2: 26 mmol/L (ref 22–32)
CREATININE: 0.68 mg/dL (ref 0.44–1.00)
Calcium: 9 mg/dL (ref 8.9–10.3)
GFR calc Af Amer: >60 mL/min (ref >60)
GFR calc non Af Amer: >60 mL/min (ref >60)
GLUCOSE: 93 mg/dL (ref 65–99)
Potassium: 3.1 mmol/L — ABNORMAL LOW (ref 3.5–5.1)
SODIUM: 138 mmol/L (ref 135–145)

## 2015-02-12 LAB — TROPONIN I

## 2015-02-12 LAB — CREATININE, SERUM: CREATININE: 0.73 mg/dL (ref 0.44–1.00)

## 2015-02-12 LAB — PROTIME-INR
INR: 1.06 (ref 0.00–1.49)
Prothrombin Time: 14 seconds (ref 11.6–15.2)

## 2015-02-12 MED ORDER — LORAZEPAM 2 MG/ML IJ SOLN: 1.0000 mg | Freq: Four times a day (QID) | INTRAMUSCULAR | Status: DC | PRN

## 2015-02-12 MED ORDER — STROKE: EARLY STAGES OF RECOVERY BOOK
Freq: Once | Status: DC
  Filled 2015-02-12: qty 1

## 2015-02-12 MED ORDER — ALPRAZOLAM 0.5 MG PO TABS: 0.5000 mg | ORAL_TABLET | Freq: Every day | ORAL | Status: DC

## 2015-02-12 MED ORDER — ENSURE ENLIVE PO LIQD
237.0000 mL | Freq: Two times a day (BID) | ORAL | Status: DC
  Administered 2015-02-13: 237 mL via ORAL

## 2015-02-12 MED ORDER — SENNOSIDES-DOCUSATE SODIUM 8.6-50 MG PO TABS: 1.0000 | ORAL_TABLET | Freq: Every evening | ORAL | Status: DC | PRN

## 2015-02-12 MED ORDER — LORAZEPAM 2 MG/ML IJ SOLN
1.0000 mg | Freq: Once | INTRAMUSCULAR | Status: AC
  Administered 2015-02-12: 1 mg via INTRAVENOUS

## 2015-02-12 MED ORDER — ENOXAPARIN SODIUM 40 MG/0.4ML ~~LOC~~ SOLN
40.0000 mg | SUBCUTANEOUS | Status: DC
  Administered 2015-02-12: 40 mg via SUBCUTANEOUS
  Filled 2015-02-12: qty 0.4

## 2015-02-12 MED ORDER — ASPIRIN 325 MG PO TABS
325.0000 mg | ORAL_TABLET | Freq: Every day | ORAL | Status: DC
  Administered 2015-02-12 – 2015-02-13 (×2): 325 mg via ORAL
  Filled 2015-02-12 (×2): qty 1

## 2015-02-12 MED ORDER — ADULT MULTIVITAMIN W/MINERALS CH
1.0000 | ORAL_TABLET | Freq: Every day | ORAL | Status: DC
  Administered 2015-02-12 – 2015-02-13 (×2): 1 via ORAL
  Filled 2015-02-12 (×2): qty 1

## 2015-02-12 MED ORDER — POTASSIUM CHLORIDE CRYS ER 20 MEQ PO TBCR
40.0000 meq | EXTENDED_RELEASE_TABLET | Freq: Two times a day (BID) | ORAL | Status: DC
  Administered 2015-02-12: 40 meq via ORAL
  Filled 2015-02-12: qty 2

## 2015-02-12 MED ORDER — LORAZEPAM 2 MG/ML IJ SOLN
INTRAMUSCULAR | Status: AC
  Filled 2015-02-12: qty 1

## 2015-02-12 MED ORDER — THIAMINE HCL 100 MG/ML IJ SOLN: 100.0000 mg | Freq: Every day | INTRAMUSCULAR | Status: DC

## 2015-02-12 MED ORDER — VITAMIN B-1 100 MG PO TABS
100.0000 mg | ORAL_TABLET | Freq: Every day | ORAL | Status: DC
  Administered 2015-02-12 – 2015-02-13 (×2): 100 mg via ORAL
  Filled 2015-02-12 (×2): qty 1

## 2015-02-12 MED ORDER — FOLIC ACID 1 MG PO TABS
1.0000 mg | ORAL_TABLET | Freq: Every day | ORAL | Status: DC
  Administered 2015-02-12 – 2015-02-13 (×2): 1 mg via ORAL
  Filled 2015-02-12 (×2): qty 1

## 2015-02-12 MED ORDER — VENLAFAXINE HCL ER 75 MG PO CP24
75.0000 mg | ORAL_CAPSULE | Freq: Every day | ORAL | Status: DC
  Administered 2015-02-13: 75 mg via ORAL
  Filled 2015-02-12 (×2): qty 1

## 2015-02-12 MED ORDER — POTASSIUM CHLORIDE CRYS ER 20 MEQ PO TBCR
40.0000 meq | EXTENDED_RELEASE_TABLET | Freq: Two times a day (BID) | ORAL | Status: AC
  Administered 2015-02-12 – 2015-02-13 (×2): 40 meq via ORAL
  Filled 2015-02-12 (×2): qty 2

## 2015-02-12 MED ORDER — LORAZEPAM 1 MG PO TABS: 1.0000 mg | ORAL_TABLET | Freq: Four times a day (QID) | ORAL | Status: DC | PRN

## 2015-02-12 MED ORDER — INFLUENZA VAC SPLIT QUAD 0.5 ML IM SUSY
0.5000 mL | PREFILLED_SYRINGE | INTRAMUSCULAR | Status: AC
  Administered 2015-02-13: 0.5 mL via INTRAMUSCULAR
  Filled 2015-02-12: qty 0.5

## 2015-02-12 MED ORDER — ACETAMINOPHEN 325 MG PO TABS
650.0000 mg | ORAL_TABLET | Freq: Four times a day (QID) | ORAL | Status: DC | PRN
  Administered 2015-02-12 – 2015-02-13 (×2): 650 mg via ORAL
  Filled 2015-02-12 (×2): qty 2

## 2015-02-12 MED ORDER — GI COCKTAIL ~~LOC~~
30.0000 mL | Freq: Two times a day (BID) | ORAL | Status: DC | PRN
  Administered 2015-02-13: 30 mL via ORAL
  Filled 2015-02-12: qty 30

## 2015-02-12 NOTE — ED Notes (Signed)
Patient transported to CT 

## 2015-02-12 NOTE — ED Notes (Signed)
Pt reports intermittent cp for several weeks. Now having pain in left arm, nausea, dizziness and headache.

## 2015-02-12 NOTE — ED Notes (Signed)
MD at bedside. 

## 2015-02-12 NOTE — ED Notes (Signed)
Nutrition made aware of pt bed assignment, will deliver food to [phone]

## 2015-02-12 NOTE — ED Notes (Signed)
Diet tray ordered for pt 

## 2015-02-12 NOTE — ED Notes (Signed)
Pt returned from CT. Pt monitored by pulse ox, bp cuff, and 5-lead.

## 2015-02-12 NOTE — ED Notes (Signed)
RN updated on pt transport to MRI, transport to take pt upstairs when testing finished.

## 2015-02-12 NOTE — ED Notes (Signed)
Family medicine MD at bedside.

## 2015-02-12 NOTE — Consult Note (Signed)
NEURO HOSPITALIST CONSULT NOTE   Referring physician: McDiarmid   Reason for Consult: HA with left arm and leg pain  HPI:                                                                                                                                          [name] is an 59 y.o. female presenting to ED due to HA, Left arm pain and CP.  She awoke this AM with throbbing HA over her right parietal region with associated chest pressure and pain in the inner aspect of her left upper arm along with blurred "vision only in her left eye".  The pain in her arm did not move beyond her elbow except.  She went to work but due to continued HA and CP she was brought to ED.  She currently states her left arm pain and CP has resolved. Currently she has a 4-5/10 HA and the HA continues to move from her left parietal region to her right frontal and bilateral occipital region.   She has history of constant daily HA about 2 years but over the last 6 months she has had no HA. She has seen a neurologist in the past "who ordered a carotid duplex" no further work up was made.  She has been on Ultram and Maloxicam in the past with no help.   The patient also reports that for the past few weeks she has had left arm and left leg pain.  The pain in the arm starts at the shoulder and radiates down the arm.  The pain at the leg starts at the hip and radiates down the leg.  The pain seems to be the instigator for the weakness.  The pain seems to be positional and based on whether she has been sitting or standing for a long period of time.     She also states she has had neck discomfort for the last 2 weeks but not associated with these current symptoms.   Past Medical History  Diagnosis Date  . Depression   . Hypertension   . Bipolar 1 disorder   . GERD (gastroesophageal reflux disease)     Past Surgical History  Procedure Laterality Date  . Cervical discectomy    . Bunion removal       Family History  Problem Relation Age of Onset  . Hypertension Mother   . Hypertension Father     Social History:  reports that she has been smoking.  She does not have any smokeless tobacco history on file. She reports that she drinks alcohol. She reports that she does not use illicit drugs.  Allergies  Allergen Reactions  . Guaifenesin & Derivatives Hives  . Lamictal [Lamotrigine] Other (See Comments)  dizziness  . Meclizine Rash    MEDICATIONS:                                                                                                                     Current Facility-Administered Medications  Medication Dose Route Frequency Provider Last Rate Last Dose  .  stroke: mapping our early stages of recovery book   Does not apply Once Joanna Puff, MD      . acetaminophen (TYLENOL) tablet 650 mg  650 mg Oral Q6H PRN Palma Holter, MD   650 mg at 02/12/15 1702  . ALPRAZolam Prudy Feeler) tablet 0.5 mg  0.5 mg Oral QHS Joanna Puff, MD      . aspirin tablet 325 mg  325 mg Oral Daily Joanna Puff, MD      . enoxaparin (LOVENOX) injection 40 mg  40 mg Subcutaneous Q24H Joanna Puff, MD      . potassium chloride SA (K-DUR,KLOR-CON) CR tablet 40 mEq  40 mEq Oral BID Joanna Puff, MD   40 mEq at 02/12/15 1627  . senna-docusate (Senokot-S) tablet 1 tablet  1 tablet Oral QHS PRN Joanna Puff, MD      . venlafaxine XR (EFFEXOR-XR) 24 hr capsule 75 mg  75 mg Oral Daily Crystal Derek Mound, MD          ROS:                                                                                                                                       History obtained from the patient  General ROS: negative for - chills, fatigue, fever, night sweats, weight gain or weight loss Psychological ROS: negative for - behavioral disorder, hallucinations, memory difficulties, mood swings or suicidal ideation Ophthalmic ROS: negative for - blurry vision, double vision, eye pain or  loss of vision ENT ROS: negative for - epistaxis, nasal discharge, oral lesions, sore throat, tinnitus or vertigo Allergy and Immunology ROS: negative for - hives or itchy/watery eyes Hematological and Lymphatic ROS: negative for - bleeding problems, bruising or swollen lymph nodes Endocrine ROS: negative for - galactorrhea, hair pattern changes, polydipsia/polyuria or temperature intolerance Respiratory ROS: negative for - cough, hemoptysis, shortness of breath or wheezing Cardiovascular ROS: negative for - chest pain, dyspnea on exertion, edema or irregular heartbeat Gastrointestinal ROS: negative for - abdominal pain, diarrhea, hematemesis, nausea/vomiting or stool incontinence  Genito-Urinary ROS: negative for - dysuria, hematuria, incontinence or urinary frequency/urgency Musculoskeletal ROS: negative for - joint swelling or muscular weakness Neurological ROS: as noted in HPI Dermatological ROS: negative for rash and skin lesion changes   Blood pressure 137/85, pulse 79, temperature 97.9 F (36.6 C), temperature source Oral, resp. rate 19, height  (1.6 m), weight 52.617 kg (116 lb), SpO2 99 %.   Neurologic Examination:                                                                                                      HEENT-  Normocephalic, no lesions, without obvious abnormality.  Normal external eye and conjunctiva.  Normal TM's bilaterally.  Normal auditory canals and external ears. Normal external nose, mucus membranes and septum.  Normal pharynx. Cardiovascular- S1, S2 normal, pulses palpable throughout   Lungs- chest clear, no wheezing, rales, normal symmetric air entry Abdomen- normal findings: bowel sounds normal Extremities- no edema Lymph-no adenopathy palpable Musculoskeletal-no deformity or swelling. Pain on palpation of the left groin area.  Pain on palpation of the left shoulder and upper arm Skin-warm and dry, no hyperpigmentation, vitiligo, or suspicious  lesions  Neurological Examination Mental Status: Alert, oriented, thought content appropriate.  Speech fluent without evidence of aphasia.  Able to follow 3 step commands without difficulty. Cranial Nerves: II: Discs flat bilaterally; Visual fields grossly normal, pupils equal, round, reactive to light and accommodation III,IV, VI: ptosis not present, extra-ocular motions intact bilaterally V,VII: smile symmetric, facial light touch sensation normal bilaterally VIII: hearing normal bilaterally IX,X: uvula rises symmetrically XI: bilateral shoulder shrug XII: midline tongue extension Motor: Right : Upper extremity   5/5    Left:     Upper extremity   5/5  Lower extremity   5/5     Lower extremity   5/5 Tone and bulk:normal tone throughout; no atrophy noted Sensory: Pinprick and light touch intact throughout, bilaterally Deep Tendon Reflexes: 1+ and symmetric throughout UE and no AJ or KJ Plantars: Right: downgoing   Left: downgoing Cerebellar: normal finger-to-nose,  and normal heel-to-shin test Gait: not tested due to leg pain     Lab Results: Basic Metabolic Panel:  Recent Labs Lab 02/12/15 0900  NA 138  K 3.1*  CL 103  CO2 26  GLUCOSE 93  BUN 12  CREATININE 0.68  CALCIUM 9.0    Liver Function Tests: No results for input(s): AST, ALT, ALKPHOS, BILITOT, PROT, ALBUMIN in the last 168 hours. No results for input(s): LIPASE, AMYLASE in the last 168 hours. No results for input(s): AMMONIA in the last 168 hours.  CBC:  Recent Labs Lab 02/12/15 0900  WBC 7.7  HGB 14.4  HCT 42.6  MCV 94.0  PLT 243    Cardiac Enzymes:  Recent Labs Lab 02/12/15 1715  TROPONINI <0.03    Lipid Panel: No results for input(s): CHOL, TRIG, HDL, CHOLHDL, VLDL, LDLCALC in the last 168 hours.  CBG: No results for input(s): GLUCAP in the last 168 hours.  Microbiology: Results for orders placed or performed during the hospital encounter of 03/06/13  Urine culture     Status:  None   Collection Time: 03/06/13 12:28 PM  Result Value Ref Range Status   Specimen Description URINE, CLEAN CATCH  Final   Special Requests NONE  Final   Culture  Setup Time   Final    03/06/2013 16:59 Performed at [facility] Count   Final    15,000 COLONIES/ML Performed at [facility]   Culture   Final    ESCHERICHIA COLI Performed at [facility]   Report Status 03/08/2013 FINAL  Final   Organism ID, Bacteria ESCHERICHIA COLI  Final      Susceptibility   Escherichia coli - MIC*    AMPICILLIN 8 SENSITIVE Sensitive     CEFAZOLIN <=4 SENSITIVE Sensitive     CEFTRIAXONE <=1 SENSITIVE Sensitive     CIPROFLOXACIN <=0.25 SENSITIVE Sensitive     GENTAMICIN <=1 SENSITIVE Sensitive     LEVOFLOXACIN <=0.12 SENSITIVE Sensitive     NITROFURANTOIN 32 SENSITIVE Sensitive     TOBRAMYCIN <=1 SENSITIVE Sensitive     TRIMETH/SULFA <=20 SENSITIVE Sensitive     PIP/TAZO <=4 SENSITIVE Sensitive     * ESCHERICHIA COLI    Coagulation Studies:  Recent Labs  02/12/15 1715  LABPROT 14.0  INR 1.06    Imaging: Dg Chest 2 View  02/12/2015   CLINICAL DATA:  Shortness of breath weakness nausea chest pain  EXAM: CHEST  2 VIEW  COMPARISON:  03/06/2013, 03/05/2014  FINDINGS: Heart size and vascular pattern are normal. No consolidation or effusion. 6 mm pulmonary nodule over the lateral right lower lobe is relatively hyperattenuating and is stable from 2014, suggesting a benign etiology such as granuloma. Mild prominence of the ascending thoracic aorta stable. Stable mild bronchitic change.  IMPRESSION: Numerous nonacute findings.  No acute abnormalities.   Electronically Signed   By: Esperanza Heir M.D.   On: 02/12/2015 09:44   Ct Head Wo Contrast  02/12/2015   CLINICAL DATA:  Severe headache, nausea onset this morning.  EXAM: CT HEAD WITHOUT CONTRAST  TECHNIQUE: Contiguous axial images were obtained from the base of the skull through the vertex without intravenous  contrast.  COMPARISON:  03/06/2013  FINDINGS: No acute intracranial abnormality. Specifically, no hemorrhage, hydrocephalus, mass lesion, acute infarction, or significant intracranial injury. No acute calvarial abnormality.  Mucosal thickening in the floor the left maxillary sinus. Remainder the paranasal sinuses and mastoids are clear.  IMPRESSION: No intracranial abnormality.   Electronically Signed   By: Charlett Nose M.D.   On: 02/12/2015 11:55   Mr Brain Wo Contrast  02/12/2015   CLINICAL DATA:  Headache with left arm pain. Right parietal headache. Symptoms began today.  EXAM: MRI HEAD WITHOUT CONTRAST  MRA HEAD WITHOUT CONTRAST  TECHNIQUE: Multiplanar, multiecho pulse sequences of the brain and surrounding structures were obtained without intravenous contrast. Angiographic images of the head were obtained using MRA technique without contrast.  COMPARISON:  Head CT same day  FINDINGS: MRI HEAD FINDINGS  The brain has a normal appearance on all pulse sequences without evidence of malformation, atrophy, old or acute infarction, mass lesion, hemorrhage, hydrocephalus or extra-axial collection. No pituitary mass. No fluid in the sinuses, middle ears or mastoids. No skull or skullbase lesion. There is flow in the major vessels at the base of the brain. Major venous sinuses show flow.  MRA HEAD FINDINGS  Both internal carotid arteries are widely patent into the brain. No siphon stenosis. The anterior and middle  cerebral vessels are normal without proximal stenosis, aneurysm or vascular malformation. Both vertebral arteries are patent to the basilar. No basilar stenosis. Posterior circulation branch vessels are normal.  IMPRESSION: Normal MRI of the brain.  Normal intracranial MR angiography.   Electronically Signed   By: Paulina Fusi M.D.   On: 02/12/2015 18:33   Mr Maxine Glenn Head/brain Wo Cm  02/12/2015   CLINICAL DATA:  Headache with left arm pain. Right parietal headache. Symptoms began today.  EXAM: MRI HEAD  WITHOUT CONTRAST  MRA HEAD WITHOUT CONTRAST  TECHNIQUE: Multiplanar, multiecho pulse sequences of the brain and surrounding structures were obtained without intravenous contrast. Angiographic images of the head were obtained using MRA technique without contrast.  COMPARISON:  Head CT same day  FINDINGS: MRI HEAD FINDINGS  The brain has a normal appearance on all pulse sequences without evidence of malformation, atrophy, old or acute infarction, mass lesion, hemorrhage, hydrocephalus or extra-axial collection. No pituitary mass. No fluid in the sinuses, middle ears or mastoids. No skull or skullbase lesion. There is flow in the major vessels at the base of the brain. Major venous sinuses show flow.  MRA HEAD FINDINGS  Both internal carotid arteries are widely patent into the brain. No siphon stenosis. The anterior and middle cerebral vessels are normal without proximal stenosis, aneurysm or vascular malformation. Both vertebral arteries are patent to the basilar. No basilar stenosis. Posterior circulation branch vessels are normal.  IMPRESSION: Normal MRI of the brain.  Normal intracranial MR angiography.   Electronically Signed   By: Paulina Fusi M.D.   On: 02/12/2015 18:33    Felicie Morn PA-C [facility] [phone]  02/12/2015, 6:50 PM   Patient seen and examined.  Clinical course and management discussed.  Necessary edits performed.  I agree with the above.  Assessment and plan of care developed and discussed below.    Assessment/Plan: 59 year old female presenting with left arm and leg weakness related to pain. Also with complaints of headacheand blurry vision.  Patient's complaints actually appear to be related to pain more than anything.  Due to other complaints though further investigation was warranted.  MRI of the brain has been personally reviewed and shows no acute changes. MRA shows no evidence of aneurysm.  Based on history of positional nature and pain, doubt TIA.     Recommendations: 1.  ASA 81mg  daily   Thana Farr, MD [facility] [phone]  02/12/2015  6:50 PM

## 2015-02-12 NOTE — ED Notes (Signed)
Pt placed in gown and in bed. Pt monitored by pulse ox, bp cuff, and 5-lead. 

## 2015-02-12 NOTE — ED Provider Notes (Signed)
CSN: [id number]     Arrival date & time 02/12/15  0849 History   First MD Initiated Contact with Patient 02/12/15 0901     Chief Complaint  Patient presents with  . Chest Pain     (Consider location/radiation/quality/duration/timing/severity/associated sxs/prior Treatment) Patient is a 59 y.o. female presenting with chest pain. The history is provided by the patient.  Chest Pain Associated symptoms: headache   Associated symptoms: no abdominal pain, no back pain, no nausea, no numbness, no shortness of breath, not vomiting and no weakness    patient presents with headache and chest pain. States she was doing fine this morning but then developed a severe throbbing headache. States her left eye was blurry but now her right eye is blurry. States she also had some cramping in her left hand. She had a previous history of headaches but states were not like this. States she tried to go to work but was not able to. States that she also developed some sharp left-sided chest pain. Last sector 2. Also some pain down her left arm. Patient's daughters with her and states the patient seems confused. No fevers or chills. No cough. No abdominal pain. No trauma.  Past Medical History  Diagnosis Date  . Depression   . Hypertension   . Bipolar 1 disorder   . GERD (gastroesophageal reflux disease)   . Hypercholesterolemia   . Anginal pain   . Migraine     "hx"  . Anxiety    Past Surgical History  Procedure Laterality Date  . Anterior cervical decomp/discectomy fusion  1990's  . Bunionectomy Bilateral 1992  . Back surgery    . Cardiac catheterization  November 07, 2007    Hattie Perch 10/27/2007   Family History  Problem Relation Age of Onset  . Hypertension Mother   . Hypertension Father    Social History  Substance Use Topics  . Smoking status: Current Every Day Smoker -- 0.50 packs/day for 41 years    Types: Cigarettes  . Smokeless tobacco: Never Used  . Alcohol Use: 4.2 oz/week    7 Cans of beer  per week   OB History    No data available     Review of Systems  Constitutional: Negative for activity change and appetite change.  Eyes: Positive for visual disturbance. Negative for pain.  Respiratory: Negative for chest tightness and shortness of breath.   Cardiovascular: Positive for chest pain. Negative for leg swelling.  Gastrointestinal: Negative for nausea, vomiting, abdominal pain and diarrhea.  Genitourinary: Negative for flank pain.  Musculoskeletal: Negative for back pain and neck stiffness.  Skin: Negative for rash.  Neurological: Positive for headaches. Negative for weakness and numbness.  Psychiatric/Behavioral: Positive for confusion. Negative for behavioral problems.      Allergies  Guaifenesin & derivatives; Lamictal; and Meclizine  Home Medications   Prior to Admission medications   Medication Sig Start Date End Date Taking? Authorizing Adellyn Capek  alendronate (FOSAMAX) 70 MG tablet Take 70 mg by mouth once a week. Saturdays 01/08/15  Yes Historical Grier Vu, MD  ALPRAZolam Prudy Feeler) 0.5 MG tablet Take 0.5 mg by mouth at bedtime.    Yes Historical Rayhana Slider, MD  cholestyramine Lanetta Inch) 4 G packet Take 4 g by mouth 2 (two) times a week.  04/29/14  Yes Historical Myrth Dahan, MD  metoprolol-hydrochlorothiazide (LOPRESSOR HCT) 50-25 MG per tablet Take 1 tablet by mouth daily.   Yes Historical Tanvir Hipple, MD  promethazine (PHENERGAN) 12.5 MG tablet Take 6.25 mg by mouth 2 (two) times  daily as needed (for vertigo or nausea).   Yes Historical Lance Galas, MD  venlafaxine XR (EFFEXOR-XR) 75 MG 24 hr capsule Take 75 mg by mouth daily. 06/21/14  Yes Historical Junice Fei, MD  traMADol (ULTRAM) 50 MG tablet Take 1 tablet (50 mg total) by mouth every 6 (six) hours as needed for pain. Patient not taking: Reported on 02/12/2015 03/06/13   Kaitlyn Szekalski, PA-C   BP 147/75 mmHg  Pulse 54  Temp(Src) 98.2 F (36.8 C) (Oral)  Resp 16  Ht  (1.6 m)  Wt 114 lb 6.4 oz (51.891 kg)   BMI 20.27 kg/m2  SpO2 93% Physical Exam  Constitutional: She appears well-developed and well-nourished.  HENT:  Head: Atraumatic.  Eyes: EOM are normal. Pupils are equal, round, and reactive to light.  Neck: Neck supple.  Cardiovascular: Normal rate.   Pulmonary/Chest: Effort normal and breath sounds normal.  Abdominal: Soft.  Neurological: She is alert.  Good grip strength bilaterally. Sensation intact over both hands. Good straight leg raise with strength bilaterally.  Skin: Skin is warm.    ED Course  Procedures (including critical care time) Labs Review Labs Reviewed  BASIC METABOLIC PANEL - Abnormal; Notable for the following:    Potassium 3.1 (*)    All other components within normal limits  HEMOGLOBIN A1C - Abnormal; Notable for the following:    Hgb A1c MFr Bld 5.8 (*)    All other components within normal limits  LIPID PANEL - Abnormal; Notable for the following:    Cholesterol 237 (*)    LDL Cholesterol 136 (*)    All other components within normal limits  CBC  PROTIME-INR  TROPONIN I  TROPONIN I  TROPONIN I  CBC  CREATININE, SERUM  TROPONIN I  BASIC METABOLIC PANEL  TSH  I-STAT TROPOININ, ED    Imaging Review Mr Brain Wo Contrast  02/12/2015   CLINICAL DATA:  Headache with left arm pain. Right parietal headache. Symptoms began today.  EXAM: MRI HEAD WITHOUT CONTRAST  MRA HEAD WITHOUT CONTRAST  TECHNIQUE: Multiplanar, multiecho pulse sequences of the brain and surrounding structures were obtained without intravenous contrast. Angiographic images of the head were obtained using MRA technique without contrast.  COMPARISON:  Head CT same day  FINDINGS: MRI HEAD FINDINGS  The brain has a normal appearance on all pulse sequences without evidence of malformation, atrophy, old or acute infarction, mass lesion, hemorrhage, hydrocephalus or extra-axial collection. No pituitary mass. No fluid in the sinuses, middle ears or mastoids. No skull or skullbase lesion. There is  flow in the major vessels at the base of the brain. Major venous sinuses show flow.  MRA HEAD FINDINGS  Both internal carotid arteries are widely patent into the brain. No siphon stenosis. The anterior and middle cerebral vessels are normal without proximal stenosis, aneurysm or vascular malformation. Both vertebral arteries are patent to the basilar. No basilar stenosis. Posterior circulation branch vessels are normal.  IMPRESSION: Normal MRI of the brain.  Normal intracranial MR angiography.   Electronically Signed   By: Paulina Fusi M.D.   On: 02/12/2015 18:33   Mr Maxine Glenn Head/brain Wo Cm  02/12/2015   CLINICAL DATA:  Headache with left arm pain. Right parietal headache. Symptoms began today.  EXAM: MRI HEAD WITHOUT CONTRAST  MRA HEAD WITHOUT CONTRAST  TECHNIQUE: Multiplanar, multiecho pulse sequences of the brain and surrounding structures were obtained without intravenous contrast. Angiographic images of the head were obtained using MRA technique without contrast.  COMPARISON:  Head  CT same day  FINDINGS: MRI HEAD FINDINGS  The brain has a normal appearance on all pulse sequences without evidence of malformation, atrophy, old or acute infarction, mass lesion, hemorrhage, hydrocephalus or extra-axial collection. No pituitary mass. No fluid in the sinuses, middle ears or mastoids. No skull or skullbase lesion. There is flow in the major vessels at the base of the brain. Major venous sinuses show flow.  MRA HEAD FINDINGS  Both internal carotid arteries are widely patent into the brain. No siphon stenosis. The anterior and middle cerebral vessels are normal without proximal stenosis, aneurysm or vascular malformation. Both vertebral arteries are patent to the basilar. No basilar stenosis. Posterior circulation branch vessels are normal.  IMPRESSION: Normal MRI of the brain.  Normal intracranial MR angiography.   Electronically Signed   By: Paulina Fusi M.D.   On: 02/12/2015 18:33   Dg Femur 1v Left  02/13/2015    CLINICAL DATA:  Left leg pain, fall 1 month and a half ago, left hip pain  EXAM: LEFT FEMUR 1 VIEW  COMPARISON:  None.  FINDINGS: Two views of the left femur submitted. No acute fracture or subluxation. No radiopaque foreign body.  IMPRESSION: Negative.   Electronically Signed   By: Natasha Mead M.D.   On: 02/13/2015 12:58   I have personally reviewed and evaluated these images and lab results as part of my medical decision-making.   EKG Interpretation   Date/Time:  Wednesday February 12 2015 08:58:46 EDT Ventricular Rate:  67 PR Interval:  166 QRS Duration: 82 QT Interval:  442 QTC Calculation: 467 R Axis:   80 Text Interpretation:  Normal sinus rhythm Septal infarct , age  undetermined Abnormal ECG Reconfirmed by Rubin Payor  MD, Harrold Donath [phone]) on  02/14/2015 2:52:08 PM      MDM   Final diagnoses:  Nonintractable headache, unspecified chronicity pattern, unspecified headache type  Chest pain, unspecified chest pain type  Weakness  Slurred speech  Unilateral weakness    Patient with headache chest pain weakness and slurred speech. Unsure of time of onset of the weakness. Initially said started right away but then started previously. Also had chest pain that is sharp and likely not cardiac cause. Will admit to internal medicine for further evaluation. Is not TPA candidate due to rather weak findings on neurologic exam and time of onset.    Benjiman Core, MD 02/14/15 1455

## 2015-02-12 NOTE — H&P (Signed)
Family Medicine Teaching [facility] Admission History and Physical Service Pager: [phone]  Patient name: [name] Medical record number: [id number] Date of birth: 08-03-1955 Age: 59 y.o. Gender: female  Primary Care Provider: Lilia Argue Consultants: neurology Code Status: FULL  Chief Complaint: CP, L sided weakness, HA  Assessment and Plan: [name] is a 59 y.o. female presenting with CP, L sided weakness, and HA. PMH is significant for bipolar depression, HTN, and GERD.   Headache and L sided weakness and numbness: Pt endorses awakening with a headache, stumbling out of bed, and daughter endorses slurred speech this morning. Although these symptoms have all occurred in the past month separately, the conglomeration is concerning for CVA. In ED, CT head without acute abnormalities, CBC and CMC WNL, except potassium of 3.1. Could be secondary to Effexor given it is a fairly new medication. Could be atypical migraine as well, however no h/o migraines per records.  - admit to telemetry, attending Dr. Lum Babe  - MRI  - repeat BMP in am - HbA1C  - TSH  - added ASA  - lipid panel  - neurology consulted, appreciate recommendations  - PT/OT/SLP  Chest pain: EKG nonconcerning for STEMI, i-trop 0.00. HEART score 2.  - cycle troponins, repeat ECG stat for chest pain   HTN: Takes metoprolol-HCTZ 50-25mg  at home. Will hold BP meds for permissive HTN in the setting of c/w CVA - monitor BPs - restart once CVA ruled out  GERD: takes omeprazole at home - protonix for home omeprazole  Bipolar Depression: Alprazolam qHS and Effexor-EX  QD.  - continue home medication  Chronic constipation: Sees Dr. Elnoria Howard as an outpatient - Continue cholestyramine.   Hypokalemia: Potassium 3.1 on admission, will replete w/ BID x2 doses. - BMP in am, replete as needed  Alcohol use: Pt endorses 2-3 20z beers every afternoon. Low suspicion for withdrawal, but will monitor  24 hrs. - CIWA protocol  FEN/GI:  NPO until passes bedside swallow, then heart healthy diet, IV saline lock Continue home omeprazole Prophylaxis: lovenox prophylaxis  Disposition: Admit to FPTS for CVA workup.   History of Present Illness:  [name] is a 59 y.o. female presenting with chest pain and L sided weakness.  She woke up "not feeling well" this morning. She noted she had a headache when she woke up and felt she had to stumble out of bed. She felt like she "got out of bed too fast."   Initially stated chest pain occurred with movement, but then notes it was while she was in the car driving to work.  Chest pain was left sided stabbing in nature and worsened with movement, rated a 8/10. Pain was improved with sitting down. Also had SOB and diaphoresis if the chest pain is "bad enough." She did not take anything for the pain. Over the last week she felt her heart was "beating faster than it should" with some improvement with breathing techniques. Notes throbbing pain from L axilla down to elbow  That started 10 days (but notes it is now occuring with chest pain). She notes her L arm is weaker than the R in the last 3 months.   A headache started today as well: it is around the top of her head like a halo, progressed to the back of her head, and is now on top of her head again.   Daughter states that she's had slurred speech x 1 month (patient denies this). Also notes using words  incorrectly. She appears to be more cautious because of her balance (pt notes her feet are turning oddly when she walks). No facial droop noted.   She's had a pain in the L groin that goes down the L leg and weakness that started approximately 6 weeks ago (had an U/S by her gyn of her ovaries and uterus and everything was fine).   She decided to come into the ED as a coworker told her her head hurting, chest pain, nausea, and arm pain were "classic stroke symptoms."  In the ED she was HDS. EKG was  non-concerning. ISTAT troponin was negative. CT head was normal. CXR did not reveal any acute abnormalities. Neurology was consulted  Currently patient endorses L hand cramping, L sided weakness, and L sided decreased sensation in face and LLE. Denies chest pain or SOB   Of note, she had "an angina attack" in the past that was "really bad." She's never had chest pain and headache together.   Review Of Systems: Per HPI with the following additions: occasional cough she feels 2/2 allergies Otherwise 12 point review of systems was performed and was unremarkable.  Patient Active Problem List   Diagnosis Date Noted  . Weakness 02/12/2015    Past Medical History: Past Medical History  Diagnosis Date  . Depression   . Hypertension   . Bipolar 1 disorder   . GERD (gastroesophageal reflux disease)    Past Surgical History: Past Surgical History  Procedure Laterality Date  . Cervical discectomy    . Bunion removal    3 cysts removed from breasts   Social History: Social History  Substance Use Topics  . Smoking status: Current Every Day Smoker  . Smokeless tobacco: None  . Alcohol Use: Yes     Comment: daily   Additional social history: Drinks 2-3 20oz beers 3-4x week. Smokes 1/2 ppd, denies illicit drugs.   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 Lives at home with her son and his girlfriend.  Please also refer to relevant sections of EMR.  Family History: Family history of diabetes and hypertension  Allergies and Medications: Allergies  Allergen Reactions  . Guaifenesin & Derivatives Hives  . Lamictal [Lamotrigine] Other (See Comments)    dizziness  . Meclizine Rash   No current facility-administered medications on file prior to encounter.   Current Outpatient Prescriptions on File Prior to Encounter  Medication Sig Dispense Refill  . ALPRAZolam (XANAX) 0.5 MG tablet Take 0.5 mg by mouth at bedtime.     . cholestyramine  (QUESTRAN) 4 G packet Take 4 g by mouth 2 (two) times a week.   5  . metoprolol-hydrochlorothiazide (LOPRESSOR HCT) 50-25 MG per tablet Take 1 tablet by mouth daily.    . promethazine (PHENERGAN) 12.5 MG tablet Take 6.25 mg by mouth 2 (two) times daily as needed (for vertigo or nausea).    . venlafaxine XR (EFFEXOR-XR) 75 MG 24 hr capsule Take 75 mg by mouth daily.  11  . azithromycin (ZITHROMAX Z-PAK) 250 MG tablet Take 1 tablet (250 mg total) by mouth daily.  PO day 1, then  PO days 205 (Patient not taking: Reported on 02/12/2015) 6 tablet 0  . predniSONE (STERAPRED UNI-PAK) 10 MG tablet Take 10-40 mg by mouth See admin instructions. Take 4 tabs daily x 3 days, 3 tabs daily x 3 days, 2 tabs daily x 3 days, then 1 tab daily x 3 days then stop    .  traMADol (ULTRAM) 50 MG tablet Take 1 tablet (50 mg total) by mouth every 6 (six) hours as needed for pain. (Patient not taking: Reported on 02/12/2015) 15 tablet 0    Objective: BP 108/84 mmHg  Pulse 73  Temp(Src) 97.9 F (36.6 C) (Oral)  Resp 20  Ht 5\' 3"  (1.6 m)  Wt 116 lb (52.617 kg)  BMI 20.55 kg/m2  SpO2 94% Exam: General: Thin white female lying in stretcher in no acute distress, non-toxic in appearance Eyes: PEERL, extraocular movements intact, no conjunctival injection or scleral icterus ENTM: moist and well perfused mucous membranes, no lesions Neck: no carotid bruits, no JVD Cardiovascular: RRR, no m/r/g; no LE edema Respiratory: CTAB, no wheezes, rhonchi, or crackles No increased WOB Abdomen: mild tenderness to deep palpation on RLQ and LLQ without rebound or guarding, nondistended, bowel sounds present Skin: no rashes, wounds, or lesions Neuro: cranial nerves 3-12 grossly intact; strength 5/5 in RUE and RLE, strength 5/5 in LUE and LLE except for grip strength which is decreased on the left; sensation slightly decreased on L UE and L face Psych: appropriate affect  Labs and Imaging: CBC BMET   Recent Labs Lab  02/12/15 0900  WBC 7.7  HGB 14.4  HCT 42.6  PLT 243    Recent Labs Lab 02/12/15 0900  NA 138  K 3.1*  CL 103  CO2 26  BUN 12  CREATININE 0.68  GLUCOSE 93  CALCIUM 9.0      Garth Bigness, Med Student 02/12/2015, 2:06 PM Medical Student (pager [phone]), [facility] [place] FPTS Intern pager: [phone], text pages welcome  RESIDENT ADDENDUM  I have separately seen and examined the patient. I have discussed the findings and exam with the medical student and agree with the above note, which I have edited appropriately. I helped develop the management plan that is described in the student's note, and I agree with the content.  Additionally I have outlined my exam and assessment/plan below:   Patient presenting with left sided chest pain with occasional radiation to the L arm (although has had L arm pain preceding this). She also headache, L arm weakness that has been ongoing x 3 months. Daughter noted slurred speech over the last month.  PE:  Blood pressure 105/70, pulse 68, temperature 98.4 F (36.9 C), temperature source Oral, resp. rate 20, height 5\' 3"  (1.6 m), weight 116 lb (52.617 kg), SpO2 99 %. General: Lying in bed in NAD. Non-toxic Eyes: Conjunctivae non-injected.  ENTM: Moist mucous membranes. Oropharynx clear. No nasal discharge.  Neck: Supple, no LAD Cardiovascular: RRR. No murmurs, rubs, or gallops noted. No pitting edema noted. Respiratory: No increased WOB. CTAB without wheezing, rhonchi, or crackles noted. Abdomen: +BS, soft, non-distended, non-tender.  MSK: Normal bulk and noted. No gross deformities noted.  Skin: No rashes noted  Neuro: A&O x4. Speech clear. No aphasia or apraxia noted. PERRLA, EOMI, Uvula and tongue midline. Facial movements symmetric. 5/5 strength in the upper extremities and lower extremities bilaterally with reduced hand grip strength on the left.  Sensation slightly decreased over L face and L UE. Normal finger to nose.   Psych:  Appropriate mood and affect.    A/P:  59 y/o with chest pain, L sided weakness, and headache. Please see MS4 note with my corrections in purple. Briefly:  Headache and left sided weakness: Possibly secondary to Effexor given this is fairly new medication.  Atypical migraine cannot be ruled out. CT head normal. Will get MRI/MRA of brain  and echo. Neurology consulted, appreciate recommendations.  Chest pain: EKG in ED without ST elevation or depression. ISTAT troponin negative. HEART score 2. Will repeat EKG in AM and trend troponins.    Joanna Puff, MD PGY-2,  [facility] 02/12/2015  9:13 PM

## 2015-02-13 ENCOUNTER — Observation Stay (HOSPITAL_COMMUNITY): Payer: BLUE CROSS/BLUE SHIELD

## 2015-02-13 ENCOUNTER — Inpatient Hospital Stay (HOSPITAL_COMMUNITY): Payer: BLUE CROSS/BLUE SHIELD

## 2015-02-13 DIAGNOSIS — R51 Headache: Secondary | ICD-10-CM

## 2015-02-13 DIAGNOSIS — R079 Chest pain, unspecified: Secondary | ICD-10-CM | POA: Insufficient documentation

## 2015-02-13 DIAGNOSIS — R0789 Other chest pain: Secondary | ICD-10-CM | POA: Diagnosis not present

## 2015-02-13 DIAGNOSIS — G43109 Migraine with aura, not intractable, without status migrainosus: Secondary | ICD-10-CM | POA: Diagnosis not present

## 2015-02-13 DIAGNOSIS — R531 Weakness: Secondary | ICD-10-CM | POA: Diagnosis not present

## 2015-02-13 DIAGNOSIS — R4781 Slurred speech: Secondary | ICD-10-CM | POA: Diagnosis not present

## 2015-02-13 DIAGNOSIS — F319 Bipolar disorder, unspecified: Secondary | ICD-10-CM | POA: Diagnosis not present

## 2015-02-13 DIAGNOSIS — R519 Headache, unspecified: Secondary | ICD-10-CM | POA: Insufficient documentation

## 2015-02-13 LAB — LIPID PANEL
CHOL/HDL RATIO: 2.9 ratio
CHOLESTEROL: 237 mg/dL — AB (ref 0–200)
HDL: 81 mg/dL (ref >40)
LDL Cholesterol: 136 mg/dL — ABNORMAL HIGH (ref 0–99)
Triglycerides: 102 mg/dL (ref <150)
VLDL: 20 mg/dL (ref 0–40)

## 2015-02-13 LAB — BASIC METABOLIC PANEL
Anion gap: 6 (ref 5–15)
BUN: 13 mg/dL (ref 6–20)
CHLORIDE: 106 mmol/L (ref 101–111)
CO2: 27 mmol/L (ref 22–32)
Calcium: 9.3 mg/dL (ref 8.9–10.3)
Creatinine, Ser: 0.68 mg/dL (ref 0.44–1.00)
GFR calc Af Amer: >60 mL/min (ref >60)
GLUCOSE: 90 mg/dL (ref 65–99)
POTASSIUM: 3.9 mmol/L (ref 3.5–5.1)
Sodium: 139 mmol/L (ref 135–145)

## 2015-02-13 LAB — TSH: TSH: 1.214 u[IU]/mL (ref 0.350–4.500)

## 2015-02-13 LAB — TROPONIN I: Troponin I: <0.03 ng/mL (ref <0.031)

## 2015-02-13 MED ORDER — KETOROLAC TROMETHAMINE 30 MG/ML IJ SOLN
30.0000 mg | Freq: Once | INTRAMUSCULAR | Status: AC
  Administered 2015-02-13: 30 mg via INTRAVENOUS
  Filled 2015-02-13: qty 1

## 2015-02-13 NOTE — Progress Notes (Signed)
Subjective: Patient reports groin pain and headache that continue today.  Pain is limiting her LLE movement proximally  Objective: Current vital signs: BP 118/77 mmHg  Pulse 67  Temp(Src) 97.7 F (36.5 C) (Oral)  Resp 16  Ht  (1.6 m)  Wt 51.891 kg (114 lb 6.4 oz)  BMI 20.27 kg/m2  SpO2 99% Vital signs in last 24 hours: Temp:  [97.7 F (36.5 C)-98.4 F (36.9 C)] 97.7 F (36.5 C) (09/22 0800) Pulse Rate:  [56-80] 67 (09/22 0800) Resp:  [16-26] 16 (09/22 0800) BP: (105-157)/(56-85) 118/77 mmHg (09/22 0800) SpO2:  [94 %-99 %] 99 % (09/22 0800) Weight:  [51.891 kg (114 lb 6.4 oz)] 51.891 kg (114 lb 6.4 oz) (09/22 0245)  Intake/Output from previous day: 09/21 0701 - 09/22 0700 In: 120 [P.O.:120] Out: 300 [Urine:300] Intake/Output this shift:   Nutritional status:    Neurologic Exam: Mental Status: Alert, oriented, thought content appropriate. Speech fluent without evidence of aphasia. Able to follow 3 step commands without difficulty. Cranial Nerves: II: Discs flat bilaterally; Visual fields grossly normal, pupils equal, round, reactive to light and accommodation III,IV, VI: ptosis not present, extra-ocular motions intact bilaterally V,VII: smile symmetric, facial light touch sensation normal bilaterally VIII: hearing normal bilaterally IX,X: uvula rises symmetrically XI: bilateral shoulder shrug XII: midline tongue extension Motor: Right :Upper extremity 5/5Left: Upper extremity 5/5 Lower extremity 5/5Lower extremity 5/5 Tone and bulk:normal tone throughout; no atrophy noted Sensory: Pinprick and light touch intact throughout, bilaterally Deep Tendon Reflexes: 1+ and symmetric throughout UE and no AJ or KJ Plantars: Right: downgoingLeft: downgoing Musculoskeletal: Pain on palpation of the groin on the left, radiating  down the leg  Lab Results: Basic Metabolic Panel:  Recent Labs Lab 02/12/15 0900 02/12/15 2239 02/13/15 0447  NA 138  --  139  K 3.1*  --  3.9  CL 103  --  106  CO2 26  --  27  GLUCOSE 93  --  90  BUN 12  --  13  CREATININE 0.68 0.73 0.68  CALCIUM 9.0  --  9.3    Liver Function Tests: No results for input(s): AST, ALT, ALKPHOS, BILITOT, PROT, ALBUMIN in the last 168 hours. No results for input(s): LIPASE, AMYLASE in the last 168 hours. No results for input(s): AMMONIA in the last 168 hours.  CBC:  Recent Labs Lab 02/12/15 0900 02/12/15 2239  WBC 7.7 5.8  HGB 14.4 13.8  HCT 42.6 40.0  MCV 94.0 94.6  PLT 243 226    Cardiac Enzymes:  Recent Labs Lab 02/12/15 1715 02/12/15 2239 02/13/15 0447  TROPONINI <0.03 <0.03 <0.03    Lipid Panel:  Recent Labs Lab 02/13/15 0447  CHOL 237*  TRIG 102  HDL 81  CHOLHDL 2.9  VLDL 20  LDLCALC 161*    CBG: No results for input(s): GLUCAP in the last 168 hours.  Microbiology: Results for orders placed or performed during the hospital encounter of 03/06/13  Urine culture     Status: None   Collection Time: 03/06/13 12:28 PM  Result Value Ref Range Status   Specimen Description URINE, CLEAN CATCH  Final   Special Requests NONE  Final   Culture  Setup Time   Final    03/06/2013 16:59 Performed at [facility] Count   Final    15,000 COLONIES/ML Performed at [facility]   Culture   Final    ESCHERICHIA COLI Performed at [facility]   Report Status  03/08/2013 FINAL  Final   Organism ID, Bacteria ESCHERICHIA COLI  Final      Susceptibility   Escherichia coli - MIC*    AMPICILLIN 8 SENSITIVE Sensitive     CEFAZOLIN <=4 SENSITIVE Sensitive     CEFTRIAXONE <=1 SENSITIVE Sensitive     CIPROFLOXACIN <=0.25 SENSITIVE Sensitive     GENTAMICIN <=1 SENSITIVE Sensitive     LEVOFLOXACIN <=0.12 SENSITIVE Sensitive     NITROFURANTOIN 32 SENSITIVE Sensitive     TOBRAMYCIN <=1 SENSITIVE  Sensitive     TRIMETH/SULFA <=20 SENSITIVE Sensitive     PIP/TAZO <=4 SENSITIVE Sensitive     * ESCHERICHIA COLI    Coagulation Studies:  Recent Labs  02/12/15 1715  LABPROT 14.0  INR 1.06    Imaging: Dg Chest 2 View  02/12/2015   CLINICAL DATA:  Shortness of breath weakness nausea chest pain  EXAM: CHEST  2 VIEW  COMPARISON:  03/06/2013, 03/05/2014  FINDINGS: Heart size and vascular pattern are normal. No consolidation or effusion. 6 mm pulmonary nodule over the lateral right lower lobe is relatively hyperattenuating and is stable from 2014, suggesting a benign etiology such as granuloma. Mild prominence of the ascending thoracic aorta stable. Stable mild bronchitic change.  IMPRESSION: Numerous nonacute findings.  No acute abnormalities.   Electronically Signed   By: Esperanza Heir M.D.   On: 02/12/2015 09:44   Ct Head Wo Contrast  02/12/2015   CLINICAL DATA:  Severe headache, nausea onset this morning.  EXAM: CT HEAD WITHOUT CONTRAST  TECHNIQUE: Contiguous axial images were obtained from the base of the skull through the vertex without intravenous contrast.  COMPARISON:  03/06/2013  FINDINGS: No acute intracranial abnormality. Specifically, no hemorrhage, hydrocephalus, mass lesion, acute infarction, or significant intracranial injury. No acute calvarial abnormality.  Mucosal thickening in the floor the left maxillary sinus. Remainder the paranasal sinuses and mastoids are clear.  IMPRESSION: No intracranial abnormality.   Electronically Signed   By: Charlett Nose M.D.   On: 02/12/2015 11:55   Mr Brain Wo Contrast  02/12/2015   CLINICAL DATA:  Headache with left arm pain. Right parietal headache. Symptoms began today.  EXAM: MRI HEAD WITHOUT CONTRAST  MRA HEAD WITHOUT CONTRAST  TECHNIQUE: Multiplanar, multiecho pulse sequences of the brain and surrounding structures were obtained without intravenous contrast. Angiographic images of the head were obtained using MRA technique without  contrast.  COMPARISON:  Head CT same day  FINDINGS: MRI HEAD FINDINGS  The brain has a normal appearance on all pulse sequences without evidence of malformation, atrophy, old or acute infarction, mass lesion, hemorrhage, hydrocephalus or extra-axial collection. No pituitary mass. No fluid in the sinuses, middle ears or mastoids. No skull or skullbase lesion. There is flow in the major vessels at the base of the brain. Major venous sinuses show flow.  MRA HEAD FINDINGS  Both internal carotid arteries are widely patent into the brain. No siphon stenosis. The anterior and middle cerebral vessels are normal without proximal stenosis, aneurysm or vascular malformation. Both vertebral arteries are patent to the basilar. No basilar stenosis. Posterior circulation branch vessels are normal.  IMPRESSION: Normal MRI of the brain.  Normal intracranial MR angiography.   Electronically Signed   By: Paulina Fusi M.D.   On: 02/12/2015 18:33   Mr Maxine Glenn Head/brain Wo Cm  02/12/2015   CLINICAL DATA:  Headache with left arm pain. Right parietal headache. Symptoms began today.  EXAM: MRI HEAD WITHOUT CONTRAST  MRA HEAD WITHOUT CONTRAST  TECHNIQUE: Multiplanar, multiecho pulse sequences of the brain and surrounding structures were obtained without intravenous contrast. Angiographic images of the head were obtained using MRA technique without contrast.  COMPARISON:  Head CT same day  FINDINGS: MRI HEAD FINDINGS  The brain has a normal appearance on all pulse sequences without evidence of malformation, atrophy, old or acute infarction, mass lesion, hemorrhage, hydrocephalus or extra-axial collection. No pituitary mass. No fluid in the sinuses, middle ears or mastoids. No skull or skullbase lesion. There is flow in the major vessels at the base of the brain. Major venous sinuses show flow.  MRA HEAD FINDINGS  Both internal carotid arteries are widely patent into the brain. No siphon stenosis. The anterior and middle cerebral vessels are  normal without proximal stenosis, aneurysm or vascular malformation. Both vertebral arteries are patent to the basilar. No basilar stenosis. Posterior circulation branch vessels are normal.  IMPRESSION: Normal MRI of the brain.  Normal intracranial MR angiography.   Electronically Signed   By: Paulina Fusi M.D.   On: 02/12/2015 18:33    Medications:  I have reviewed the patient's current medications. Scheduled: . ALPRAZolam  0.5 mg Oral QHS  . aspirin  325 mg Oral Daily  . enoxaparin (LOVENOX) injection  40 mg Subcutaneous Q24H  . feeding supplement (ENSURE ENLIVE)  237 mL Oral BID BM  . folic acid  1 mg Oral Daily  . Influenza vac split quadrivalent PF  0.5 mL Intramuscular Tomorrow-1000  . ketorolac  30 mg Intravenous Once  . multivitamin with minerals  1 tablet Oral Daily  . thiamine  100 mg Oral Daily   Or  . thiamine  100 mg Intravenous Daily  . venlafaxine XR  75 mg Oral Daily    Assessment/Plan: Patient without evidence of infarct.  Symptoms appear to be mediated by pain.  No further neurological work up recommended at this time.    LOS: 1 day   Thana Farr, MD [facility] [phone] 02/13/2015  11:13 AM

## 2015-02-13 NOTE — Discharge Instructions (Signed)
Talk to your primary care provider about future medications for migraines. Talk to your primary care provider about leg pain if it does not improve. Your XRAY did not show any fractures. Continue to use nicotine patches to help you stop smoking, this is the BEST thing you can do for your help. The Quitline 1-800-QUIT-NOW 7606598465) is a great FREE resource with coaches who will follow up with your progress.  Migraine Headache A migraine headache is an intense, throbbing pain on one or both sides of your head. A migraine can last for 30 minutes to several hours. CAUSES  The exact cause of a migraine headache is not always known. However, a migraine may be caused when nerves in the brain become irritated and release chemicals that cause inflammation. This causes pain. Certain things may also trigger migraines, such as:  Alcohol.  Smoking.  Stress.  Menstruation.  Aged cheeses.  Foods or drinks that contain nitrates, glutamate, aspartame, or tyramine.  Lack of sleep.  Chocolate.  Caffeine.  Hunger.  Physical exertion.  Fatigue.  Medicines used to treat chest pain (nitroglycerine), birth control pills, estrogen, and some blood pressure medicines. SIGNS AND SYMPTOMS  Pain on one or both sides of your head.  Pulsating or throbbing pain.  Severe pain that prevents daily activities.  Pain that is aggravated by any physical activity.  Nausea, vomiting, or both.  Dizziness.  Pain with exposure to bright lights, loud noises, or activity.  General sensitivity to bright lights, loud noises, or smells. Before you get a migraine, you may get warning signs that a migraine is coming (aura). An aura may include:  Seeing flashing lights.  Seeing bright spots, halos, or zigzag lines.  Having tunnel vision or blurred vision.  Having feelings of numbness or tingling.  Having trouble talking.  Having muscle weakness. DIAGNOSIS  A migraine headache is often diagnosed  based on:  Symptoms.  Physical exam.  A CT scan or MRI of your head. These imaging tests cannot diagnose migraines, but they can help rule out other causes of headaches. TREATMENT Medicines may be given for pain and nausea. Medicines can also be given to help prevent recurrent migraines.  HOME CARE INSTRUCTIONS  Only take over-the-counter or prescription medicines for pain or discomfort as directed by your health care provider. The use of long-term narcotics is not recommended.  Lie down in a dark, quiet room when you have a migraine.  Keep a journal to find out what may trigger your migraine headaches. For example, write down:  What you eat and drink.  How much sleep you get.  Any change to your diet or medicines.  Limit alcohol consumption.  Quit smoking if you smoke.  Get 7-9 hours of sleep, or as recommended by your health care provider.  Limit stress.  Keep lights dim if bright lights bother you and make your migraines worse. SEEK IMMEDIATE MEDICAL CARE IF:   Your migraine becomes severe.  You have a fever.  You have a stiff neck.  You have vision loss.  You have muscular weakness or loss of muscle control.  You start losing your balance or have trouble walking.  You feel faint or pass out.  You have severe symptoms that are different from your first symptoms. MAKE SURE YOU:   Understand these instructions.  Will watch your condition.  Will get help right away if you are not doing well or get worse. Document Released: 05/10/2005 Document Revised: 09/24/2013 Document Reviewed: 01/15/2013 Exit[employer] Patient Information  2015 [employer]. This information is not intended to replace advice given to you by your health care provider. Make sure you discuss any questions you have with your health care provider. ° °

## 2015-02-13 NOTE — Progress Notes (Signed)
Nutrition Brief Note  Patient identified on the Malnutrition Screening Tool (MST) Report. Patient reports that she has lost ~5-6 lbs over the past 2 months. She usually weighs ~120 lbs. She has been eating well at home; trying to stay away from cheeseburgers and other unhealthy foods because her cholesterol has been elevated. Suspect this is the reason for her weight loss. Nutrition focused physical exam completed.  No muscle or subcutaneous fat depletion noticed.   Wt Readings from Last 15 Encounters:  02/13/15 114 lb 6.4 oz (51.891 kg)  01/11/12 114 lb (51.71 kg)    Body mass index is 20.27 kg/(m^2). Patient meets criteria for normal weight based on current BMI.   Current diet order is regular. Labs and medications reviewed.   No nutrition interventions warranted at this time. If nutrition issues arise, please consult RD.   Joaquin Courts, RD, LDN, CNSC Pager [phone] After Hours Pager [phone]

## 2015-02-13 NOTE — Progress Notes (Signed)
Family Medicine Teaching Service Daily Progress Note Intern Pager: [phone]  Patient name: [name] Medical record number: [id number] Date of birth: 23-Sep-1955 Age: 59 y.o. Gender: female  Primary Care Provider: Lilia Jacobs Jacobs: neurology Code Status: FULL  Pt Overview and Major Events to Date:  Pt feels much better this am. Endorses continued headache as well as L groin and thigh pain.  Assessment and Plan: [name] is a 59 y.o. female presenting with CP, L sided weakness, and HA. PMH is significant for bipolar depression, HTN, and GERD.   Headache and L sided weakness and numbness: Pt endorses awakening with a headache, stumbling out of bed, and daughter endorses slurred speech this morning. Although these symptoms have all occurred in the past month separately, the conglomeration is concerning for CVA. In ED, CT head without acute abnormalities, CBC and CMC WNL, except potassium of 3.1. Could be secondary to Effexor given it is a fairly new medication. Could be atypical migraine as well, pt endorses migraines in the past. MRI/MRA normal. Lipid panel - total cholesterol 237, LDL 136. ASCVD risk <%5 thus no indication for statin at this time. Neurology consult felt these symptoms could be related to headache pain, but are not suggestive of TIA. - HbA1C in process - TSH pending  - added ASA  - PT/OT/SLP - f/u echo - toradol  IV now for migraine  Left groin pain, radiating to thigh: Pt endorses this for about 6 weeks. Consider XR of L hip and femur 2/2 alendronate use and risk of occult femur fracture. Think this is less likely. Could pursue as outpatient. Likely musculoskeletal tension.  - consider L hip and femur XR  Chest pain: EKG nonconcerning for STEMI, i-trop 0.00. HEART score 2. Troponins <0.03 x 3.  - repeat ECG stat for chest pain  HTN: Takes metoprolol-HCTZ 50-25mg  at home. Will hold BP meds for permissive HTN in the setting of c/w CVA -  monitor BPs - continue to hold metoprolol because her HR cannot tolerate as of now  GERD: takes omeprazole at home - protonix for home omeprazole  Bipolar Depression: Alprazolam qHS and Effexor-EX  QD.  - continue home medication  Chronic constipation: Sees Dr. Elnoria Howard as an outpatient - Continue cholestyramine.   Hypokalemia, resolved: Potassium 3.1 on admission, will replete w/ BID x2 doses. - BMP in am, replete as needed  Alcohol use: Pt endorses 2-3 20z beers every afternoon. Low suspicion for withdrawal, but will monitor 24 hrs. - CIWA protocol  FEN/GI: NPO until passes bedside swallow, then heart healthy diet, IV saline lock Continue home omeprazole Prophylaxis: lovenox prophylaxis  Disposition: home today   Subjective:  Pt feels much better this am. Endorses continued headache as well as L groin and thigh pain. Would like to know the cause of the leg pain. Has has migraines in the past, tramadol and meloxicam have worked for her in the past. Would be amenable to trying something similar to these today.  Objective: Temp:  [97.7 F (36.5 C)-98.4 F (36.9 C)] 97.7 F (36.5 C) (09/22 0800) Pulse Rate:  [56-80] 67 (09/22 0800) Resp:  [16-26] 16 (09/22 0800) BP: (105-162)/(56-92) 118/77 mmHg (09/22 0800) SpO2:  [94 %-100 %] 99 % (09/22 0800) Weight:  [114 lb 6.4 oz (51.891 kg)-116 lb (52.617 kg)] 114 lb 6.4 oz (51.891 kg) (09/22 0245) Physical Exam: General: Thin [name] female lying in bed in no acute distress, non-toxic in appearance Eyes: PEERL, extraocular movements intact, no conjunctival injection  or scleral icterus ENTM: moist and well perfused mucous membranes, no lesions Cardiovascular: RRR, no m/r/g; no LE edema Respiratory: CTAB, no wheezes, rhonchi, or crackles No increased WOB Abdomen: mild tenderness to deep palpation on RLQ and LLQ without rebound or guarding, nondistended, bowel sounds present Psych: appropriate affect  Laboratory:  Recent  Labs Lab 02/12/15 0900 02/12/15 2239  WBC 7.7 5.8  HGB 14.4 13.8  HCT 42.6 40.0  PLT 243 226    Recent Labs Lab 02/12/15 0900 02/12/15 2239 02/13/15 0447  NA 138  --  139  K 3.1*  --  3.9  CL 103  --  106  CO2 26  --  27  BUN 12  --  13  CREATININE 0.68 0.73 0.68  CALCIUM 9.0  --  9.3  GLUCOSE 93  --  90   Imaging/Diagnostic Tests: CT head - no intracranial abnormality MRI/MRA head - normal MRI brain  Garth Bigness, Med Student 02/13/2015, 8:28 AM Medical Student (pager [phone]), [facility] FPTS Intern pager: [phone], text pages welcome  RESIDENT ADDENDUM  I have separately seen and examined the patient. I have discussed the findings and exam with the medical student, helped develop the management plan that is described in the student's note, and I agree with the content.  Additionally I have outlined my exam and assessment/plan below:   S:  Still has headache not helped very much by toradol. Feels better knowing test results are negative.  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 MRI/MRA normal. Chest and groin pain are likely musculoskeletal. ACS ruled out with troponins and stable nonischemic ECG. Recommending migraine prophylaxis to be discussed with PCP. Discharge today.   Ryan B. Jarvis Newcomer, MD, PGY-3 02/13/2015 3:17 PM

## 2015-02-13 NOTE — Evaluation (Addendum)
Physical Therapy Evaluation Patient Details Name: [name] MRN: [id number] DOB: [birth year] Today's Date: 02/13/2015   History of Present Illness  59 y.o. female admitted to [facility] on 02/12/15 for chest pain, left sided weakness/headache.  Cardiac and stroke workup in progress.  MRI and CT were negative for acute events.  Pt with other significant PMhx of depression, HTN, bipolar, migraine, anxiety, anterior cervical decompression, and per pt report vertigo.    Clinical Impression  Pt is independent with all mobility.  Her primary concern is her left hip and groin pain, but it did not seem to produce any gait issues or abnormalities.  She was able to preform a flight of stairs with on railing alternating pattern without signs of pain or difficulty.  PT to sign off as she has no acute or f/u PT or DME needs at this time.     Follow Up Recommendations No PT follow up    Equipment Recommendations  None recommended by PT    Recommendations for Other Services   NA    Precautions / Restrictions Precautions Precautions: None      Mobility  Bed Mobility Overal bed mobility: Independent                Transfers Overall transfer level: Independent                  Ambulation/Gait Ambulation/Gait assistance: Independent         Gait velocity interpretation: at or above normal speed for age/gender    Stairs Stairs: Yes Stairs assistance: Modified independent (Device/Increase time) Stair Management: One rail Right;Alternating pattern;Forwards Number of Stairs: 9        Modified Rankin (Stroke Patients Only) Modified Rankin (Stroke Patients Only) Pre-Morbid Rankin Score: No symptoms Modified Rankin: No significant disability     Balance Overall balance assessment: Independent;No apparent balance deficits (not formally assessed)                                           Pertinent Vitals/Pain Pain Assessment: Faces Faces Pain Scale:  Hurts a little bit Pain Location: left groin and thigh Pain Descriptors / Indicators: Aching Pain Intervention(s): Limited activity within patient's tolerance;Monitored during session;Repositioned    Home Living Family/patient expects to be discharged to:: Private residence Living Arrangements: Children (son and his girlfriend. ) Available Help at Discharge: Family;Available PRN/intermittently Type of Home: House       Home Layout: Two level        Prior Function Level of Independence: Independent         Comments: pt works full time, drives        Extremity/Trunk Assessment   Upper Extremity Assessment: Overall WFL for tasks assessed (pt reports she is see Dr. Butler Denmark for bil carpal tunnel)           Lower Extremity Assessment: Overall WFL for tasks assessed (strength, coordination, sensation WNL)      Cervical / Trunk Assessment: Other exceptions  Communication   Communication: No difficulties  Cognition Arousal/Alertness: Awake/alert Behavior During Therapy: WFL for tasks assessed/performed Overall Cognitive Status: Within Functional Limits for tasks assessed                      General Comments General comments (skin integrity, edema, etc.): Pt is able to put full weight on left leg holding to a railing  and rotate without any pain.  PT also preformed labrum test (hip scour test) to check for possible hip labrum defects or tears and that was negative.  She report no real history of low back issues (only cervical) and occationally numbness and tingling into her left leg, but none currently.           Assessment/Plan    PT Assessment Patent does not need any further PT services  PT Diagnosis Difficulty walking;Abnormality of gait;Generalized weakness;Acute pain         PT Goals (Current goals can be found in the Care Plan section) Acute Rehab PT Goals Patient Stated Goal: to go home, figure out why her hip is hurting PT Goal Formulation: All  assessment and education complete, DC therapy               End of Session   Activity Tolerance: Patient tolerated treatment well Patient left: in bed;with call bell/phone within reach           Time: 1020-1038 PT Time Calculation (min) (ACUTE ONLY): 18 min   Charges:   PT Evaluation $Initial PT Evaluation Tier I: 1 Procedure          Rebecca B. Medendorp, PT, DPT [phone]   02/13/2015, 10:55 AM

## 2015-02-13 NOTE — Progress Notes (Signed)
Pt is ambulating w/o assist and is at or close to baseline with basic adls.  Pt concerned with hip pain although it does not keep her from completing basic adls.  Will sign off at this time. Tory Emerald, [place] [phone]

## 2015-02-13 NOTE — Progress Notes (Signed)
  Echocardiogram 2D Echocardiogram has been performed.  Tye Savoy 02/13/2015, 10:03 AM

## 2015-02-13 NOTE — Progress Notes (Signed)
UR Completed Brenda Graves-Bigelow, RN,BSN [phone]  

## 2015-02-13 NOTE — Care Management Note (Signed)
Case Management Note  Patient Details  Name: [name] MRN: [id number] Date of Birth: 08-16-1955  Subjective/Objective: Pt admitted for Chest pain with left sided weakness. MRI/CT negative. Neurology consulted per notes doubt TIA.                    Action/Plan: No needs identified by CM at this time.    Expected Discharge Date:                  Expected Discharge Plan:  Home/Self Care  In-House Referral:  NA  Discharge planning Services  CM Consult  Post Acute Care Choice:  NA Choice offered to:  NA  DME Arranged:  N/A DME Agency:     HH Arranged:  NA HH Agency:     Status of Service:  Completed, signed off  Medicare Important Message Given:    Date Medicare IM Given:    Medicare IM give by:    Date Additional Medicare IM Given:    Additional Medicare Important Message give by:     If discussed at Long Length of Stay Meetings, dates discussed:    Additional Comments:  [place], [place], RN 02/13/2015, 11:03 AM

## 2015-02-14 LAB — HEMOGLOBIN A1C
Hgb A1c MFr Bld: 5.8 % — ABNORMAL HIGH (ref 4.8–5.6)
MEAN PLASMA GLUCOSE: 120 mg/dL

## 2015-05-30 NOTE — Discharge Summary (Signed)
Family Medicine Teaching [facility] Discharge Summary  Patient name: [name] Medical record number: [id number] Date of birth: 1955-06-22 Age: 60 y.o. Gender: female Date of Admission: 02/12/2015  Date of Discharge: 02/13/2015 Admitting Physician: Dorothea Ogle, MD  Primary Care Provider: Lilia Argue Consultants: Neurology  Indication for Hospitalization: Rule out CVA  Discharge Diagnoses/Problem List:  Patient Active Problem List   Diagnosis Date Noted  . Pain in the chest   . Cephalalgia   . Slurred speech   . Unilateral weakness   . Migraine with aura and without status migrainosus, not intractable   . Weakness 02/12/2015   Disposition: Discharged home  Discharge Condition: Stable  Discharge Exam: See progress note from 02/13/2015  Brief Hospital Course:  [name] is a 60 y.o. female presenting with CP, L sided weakness, and HA. PMH is significant for bipolar depression, HTN, and GERD.   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 Neurology consultant advised no further diagnostic work up. Treatment was deferred to the outpatient setting. Symptoms improved overnight, specifically the pt and family seemed relieved by negative imaging results. They were discharged the following day.   Chest and groin pain were thought to be likely musculoskeletal. ACS ruled out with troponins and stable nonischemic ECG.   Issues for Follow Up:  1. Treatment/prophylaxis for migraines if a recurrent episode occurs.   Significant Procedures: None  Significant Labs and Imaging:  Laboratory:  Lab 02/12/15 0900 02/12/15 2239 WBC 7.7 5.8 HGB 14.4 13.8 HCT 42.6 40.0 PLT 243 226  Lab 02/12/15 0900 02/12/15 2239 02/13/15 0447 NA 138 --  139 K 3.1* --  3.9 CL 103 --  106 CO2 26 --  27 BUN 12 --   13 CREATININE 0.68 0.73 0.68 CALCIUM 9.0 --  9.3 GLUCOSE 93 --  90   CT head - no intracranial abnormality MRI/MRA head - normal MRI brain  Results/Tests Pending at Time of Discharge: None  Discharge Medications:    Medication List    STOP taking these medications        azithromycin 250 MG tablet  Commonly known as:  ZITHROMAX Z-PAK     predniSONE 10 MG tablet  Commonly known as:  STERAPRED UNI-PAK      TAKE these medications        alendronate 70 MG tablet  Commonly known as:  FOSAMAX  Take 70 mg by mouth once a week. Saturdays     ALPRAZolam 0.5 MG tablet  Commonly known as:  XANAX  Take 0.5 mg by mouth at bedtime.     cholestyramine 4 g packet  Commonly known as:  QUESTRAN  Take 4 g by mouth 2 (two) times a week.     metoprolol-hydrochlorothiazide 50-25 MG tablet  Commonly known as:  LOPRESSOR HCT  Take 1 tablet by mouth daily.     promethazine 12.5 MG tablet  Commonly known as:  PHENERGAN  Take 6.25 mg by mouth 2 (two) times daily as needed (for vertigo or nausea).     traMADol 50 MG tablet  Commonly known as:  ULTRAM  Take 1 tablet (50 mg total) by mouth every 6 (six) hours as needed for pain.     venlafaxine XR 75 MG 24 hr capsule  Commonly known as:  EFFEXOR-XR  Take 75 mg by mouth daily.        Discharge Instructions:  Please refer to Patient Instructions section of EMR for full details.  Patient was counseled important signs and symptoms that should prompt return to medical care, changes in medications, dietary instructions, activity restrictions, and follow up appointments.   Follow-Up Appointments:     Follow-up Information    Schedule an appointment as soon as possible for a visit with KAPLAN,KRISTEN, PA-C.   Specialty:  Family Medicine   Why:  Call to make an appt with your PCP within 1-2 weeks   Contact information:   [address] [place] [address] [phone]       Tyrone Nineyan B Grunz, MD 05/30/2015, 2:34  PM PGY-3, [facility]

## 2016-01-08 IMAGING — DX DG CHEST 2V
2 series · 2 of 2 positions shown · non-contrast
Comparison: 03/06/2013, 03/05/2014

CLINICAL DATA: Shortness of breath weakness nausea chest pain

EXAM:
CHEST  2 VIEW

[chest lat]
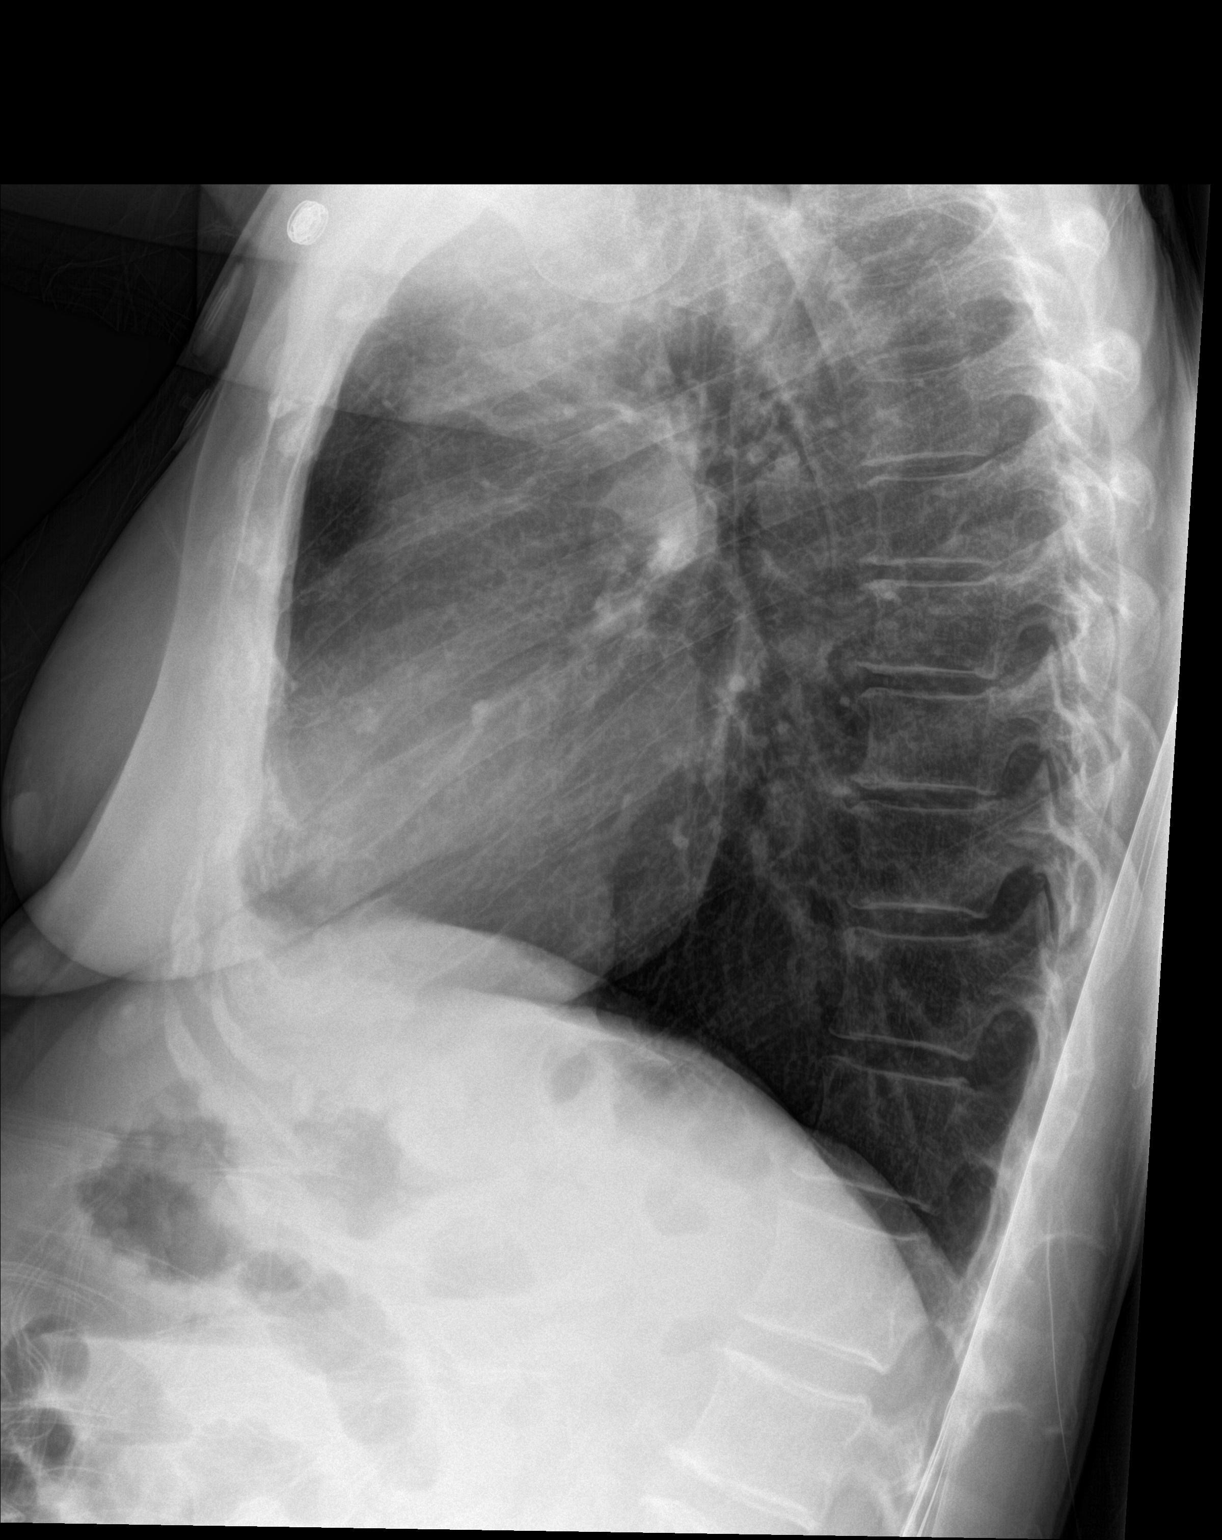

[chest ap]
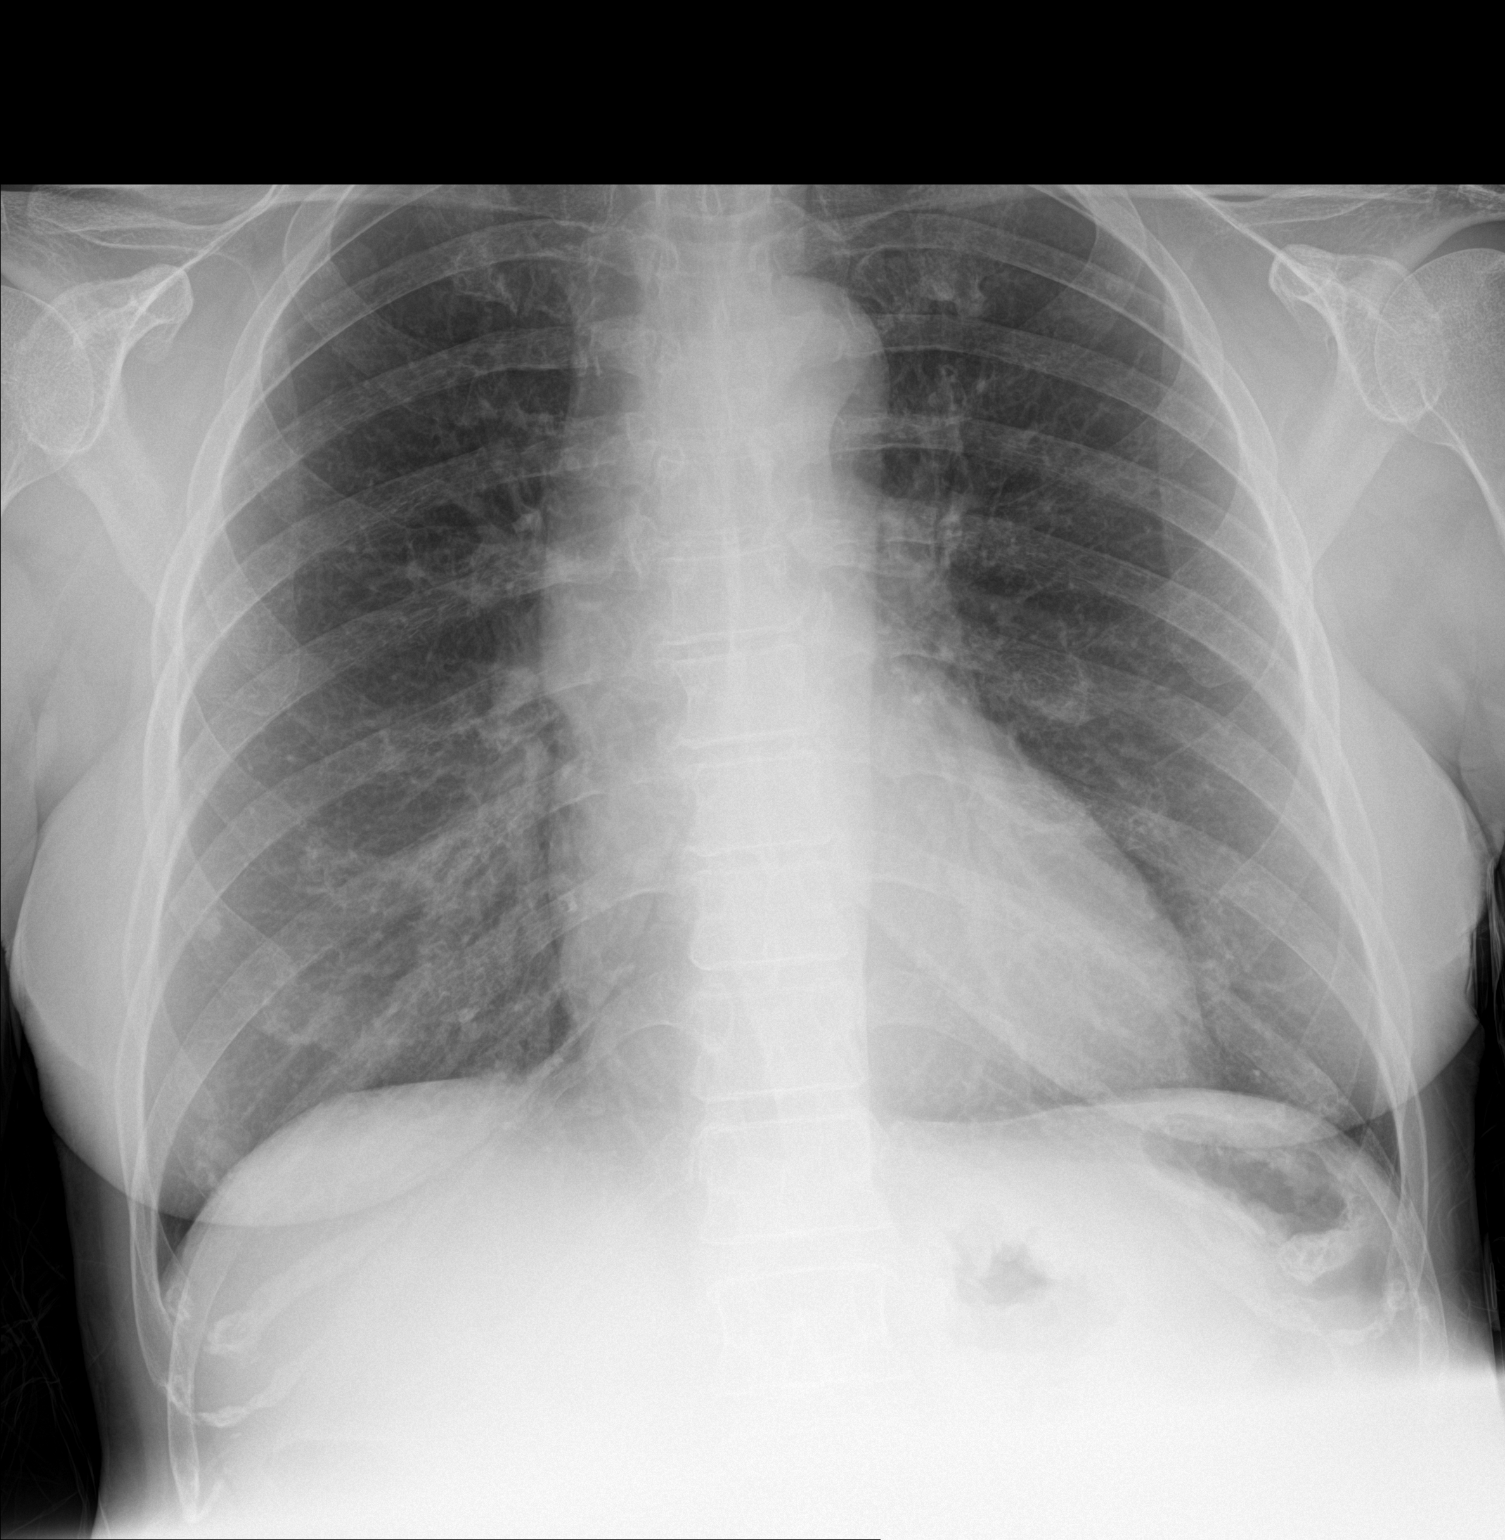

[2 of 2 positions shown; findings below may reference images not displayed]

FINDINGS: Heart size and vascular pattern are normal. No consolidation or
effusion. 6 mm pulmonary nodule over the lateral right lower lobe is
relatively hyperattenuating and is stable from 0026, suggesting a
benign etiology such as granuloma. Mild prominence of the ascending
thoracic aorta stable. Stable mild bronchitic change.
IMPRESSION: Numerous nonacute findings.  No acute abnormalities.

## 2016-01-09 IMAGING — CR DG FEMUR 1V*L*
2 series · 2 of 2 positions shown · non-contrast
Comparison: None.

CLINICAL DATA: Left leg pain, fall 1 month and a half ago, left hip
pain

EXAM:
LEFT FEMUR 1 VIEW

[femur lat (1 of 2)]
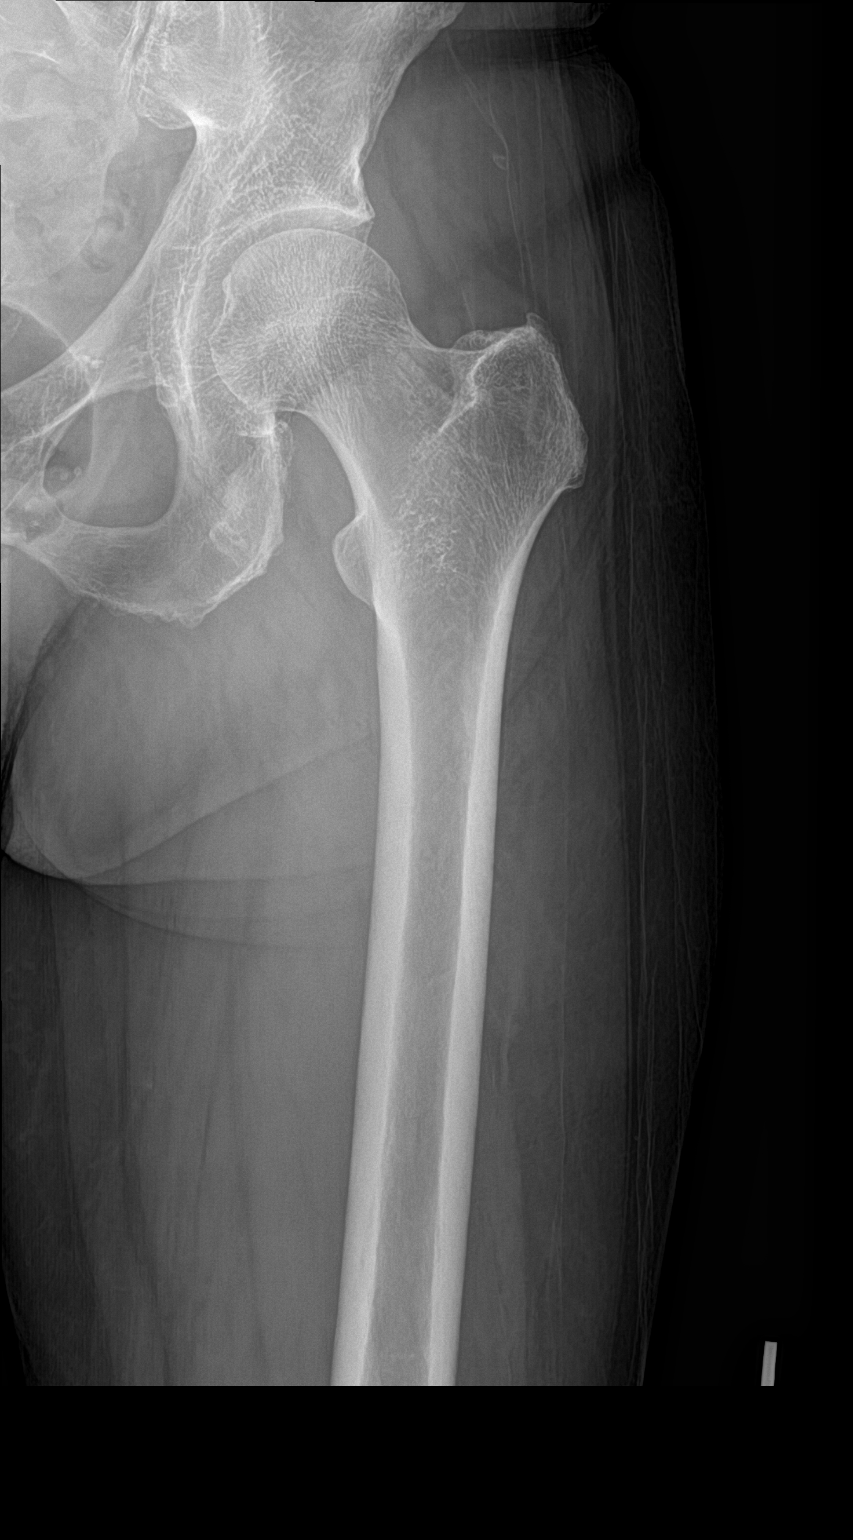

[femur lat (2 of 2)]
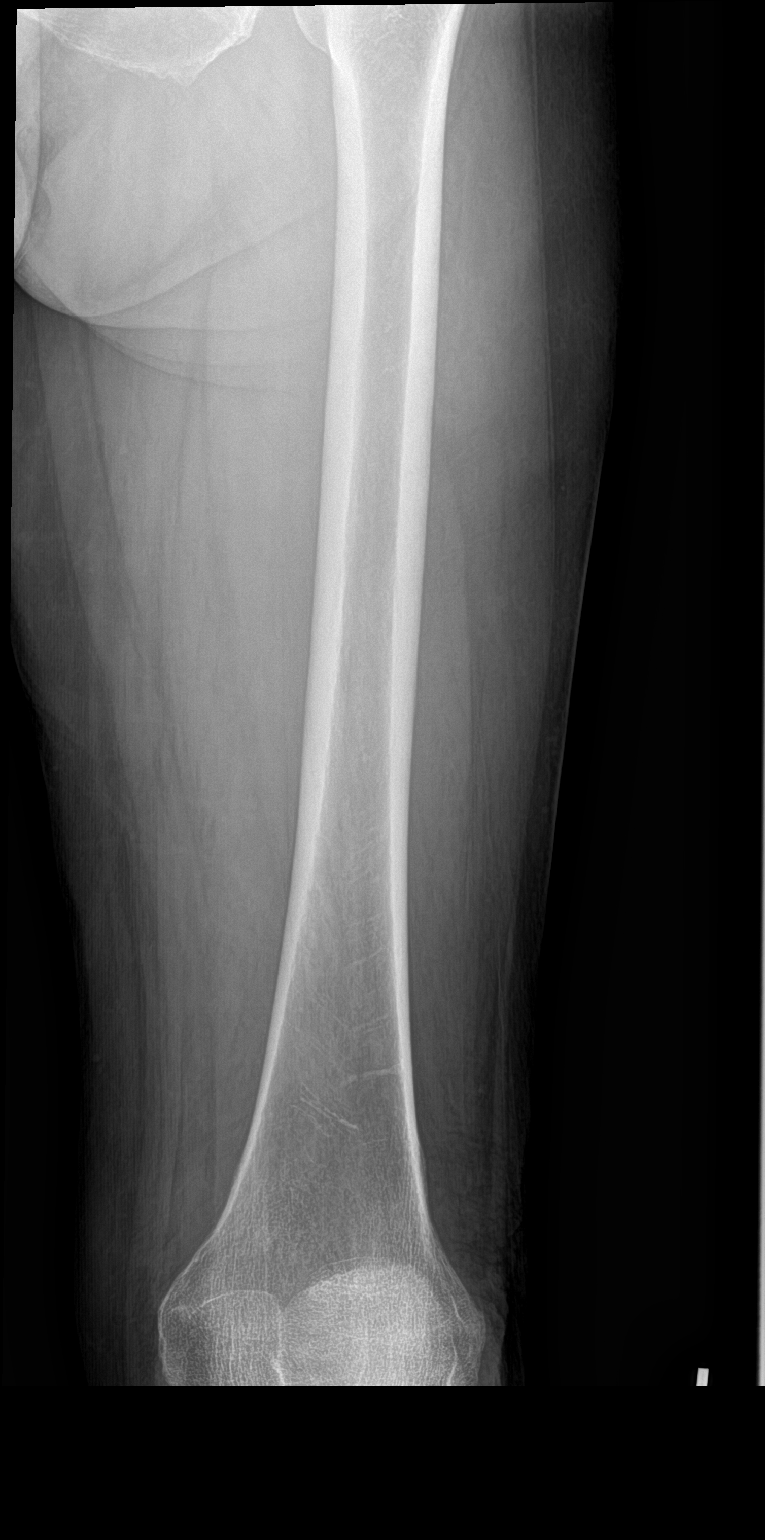

[2 of 2 positions shown; findings below may reference images not displayed]

FINDINGS: Two views of the left femur submitted. No acute fracture or
subluxation. No radiopaque foreign body.
IMPRESSION: Negative.

## 2020-02-05 ENCOUNTER — Encounter (HOSPITAL_COMMUNITY): Payer: Self-pay | Admitting: Emergency Medicine

## 2020-02-05 ENCOUNTER — Emergency Department (HOSPITAL_COMMUNITY)
Admission: EM | Admit: 2020-02-05 | Discharge: 2020-02-05 | Disposition: A | Discharge status: Home / Self Care | Payer: BLUE CROSS/BLUE SHIELD | Attending: Emergency Medicine | Admitting: Emergency Medicine

## 2020-02-05 ENCOUNTER — Other Ambulatory Visit: Payer: Self-pay

## 2020-02-05 DIAGNOSIS — M533 Sacrococcygeal disorders, not elsewhere classified: Secondary | ICD-10-CM | POA: Insufficient documentation

## 2020-02-05 DIAGNOSIS — Z5321 Procedure and treatment not carried out due to patient leaving prior to being seen by health care provider: Secondary | ICD-10-CM | POA: Insufficient documentation

## 2020-02-05 NOTE — ED Notes (Signed)
Tech removed IV 

## 2020-02-05 NOTE — ED Triage Notes (Signed)
Patient arrived with EMS from home reports pain at sacral area since Friday after riding a horse , denies injury or fall  , pain increases with movement , she received Fentanyl 75 mcg by EMS prior to arrival with relief.

## 2020-11-10 ENCOUNTER — Other Ambulatory Visit: Payer: Self-pay | Admitting: Family Medicine

## 2020-11-10 DIAGNOSIS — Z1231 Encounter for screening mammogram for malignant neoplasm of breast: Secondary | ICD-10-CM

## 2022-08-04 ENCOUNTER — Other Ambulatory Visit: Payer: Self-pay | Admitting: Family Medicine

## 2022-08-04 DIAGNOSIS — R209 Unspecified disturbances of skin sensation: Secondary | ICD-10-CM

## 2022-08-04 DIAGNOSIS — R200 Anesthesia of skin: Secondary | ICD-10-CM

## 2022-08-31 ENCOUNTER — Other Ambulatory Visit: Payer: Self-pay

## 2022-09-01 ENCOUNTER — Institutional Professional Consult (permissible substitution) (HOSPITAL_BASED_OUTPATIENT_CLINIC_OR_DEPARTMENT_OTHER): Payer: Self-pay | Admitting: Pulmonary Disease

## 2022-09-15 ENCOUNTER — Other Ambulatory Visit: Payer: Self-pay

## 2022-09-22 ENCOUNTER — Institutional Professional Consult (permissible substitution) (HOSPITAL_BASED_OUTPATIENT_CLINIC_OR_DEPARTMENT_OTHER): Payer: Self-pay | Admitting: Pulmonary Disease

## 2022-09-29 ENCOUNTER — Institutional Professional Consult (permissible substitution) (HOSPITAL_BASED_OUTPATIENT_CLINIC_OR_DEPARTMENT_OTHER): Payer: Self-pay | Admitting: Pulmonary Disease

## 2022-10-20 ENCOUNTER — Ambulatory Visit
Admission: RE | Admit: 2022-10-20 | Discharge: 2022-10-20 | Disposition: A | Discharge status: Home / Self Care | Payer: PPO | Source: Ambulatory Visit | Attending: Family Medicine | Admitting: Family Medicine

## 2022-10-20 DIAGNOSIS — R209 Unspecified disturbances of skin sensation: Secondary | ICD-10-CM

## 2022-10-20 DIAGNOSIS — R200 Anesthesia of skin: Secondary | ICD-10-CM

## 2022-11-03 ENCOUNTER — Institutional Professional Consult (permissible substitution) (HOSPITAL_BASED_OUTPATIENT_CLINIC_OR_DEPARTMENT_OTHER): Payer: PPO | Admitting: Pulmonary Disease

## 2022-12-02 ENCOUNTER — Institutional Professional Consult (permissible substitution) (HOSPITAL_BASED_OUTPATIENT_CLINIC_OR_DEPARTMENT_OTHER): Payer: PPO | Admitting: Pulmonary Disease

## 2023-01-14 ENCOUNTER — Encounter: Payer: PPO | Admitting: Vascular Surgery

## 2023-01-18 ENCOUNTER — Institutional Professional Consult (permissible substitution) (HOSPITAL_BASED_OUTPATIENT_CLINIC_OR_DEPARTMENT_OTHER): Payer: PPO | Admitting: Pulmonary Disease

## 2023-03-01 NOTE — Progress Notes (Deleted)
   Patient ID: [name], female   DOB: 04-02-1956, 67 y.o.   MRN: [id number]  Reason for Consult: No chief complaint on file.   Referred by Richmond Campbell., PA-C  Subjective:     HPI  [name] is a 67 y.o. female ***  Past Medical History:  Diagnosis Date   Anginal pain (HCC)    Anxiety    Bipolar 1 disorder (HCC)    Depression    GERD (gastroesophageal reflux disease)    Hypercholesterolemia    Hypertension    Migraine    "hx"   Family History  Problem Relation Age of Onset   Hypertension Mother    Hypertension Father    Past Surgical History:  Procedure Laterality Date   ANTERIOR CERVICAL DECOMP/DISCECTOMY FUSION  [phone]   BACK SURGERY     BUNIONECTOMY Bilateral 1992   CARDIAC CATHETERIZATION  November 07, 2007   Hattie Perch 10/27/2007    Short Social History:  Social History   Tobacco Use   Smoking status: Every Day    Current packs/day: 0.50    Average packs/day: 0.5 packs/day for 41.0 years (20.5 ttl pk-yrs)    Types: Cigarettes   Smokeless tobacco: Never  Substance Use Topics   Alcohol use: Yes    Alcohol/week: 7.0 standard drinks of alcohol    Types: 7 Cans of beer per week    Allergies  Allergen Reactions   Guaifenesin & Derivatives Hives   Lamictal [Lamotrigine] Other (See Comments)    dizziness   Meclizine Rash    Current Outpatient Medications  Medication Sig Dispense Refill   alendronate (FOSAMAX) 70 MG tablet Take 70 mg by mouth once a week. Saturdays  12   ALPRAZolam (XANAX) 0.5 MG tablet Take 0.5 mg by mouth at bedtime.      cholestyramine (QUESTRAN) 4 G packet Take 4 g by mouth 2 (two) times a week.   5   metoprolol-hydrochlorothiazide (LOPRESSOR HCT) 50-25 MG per tablet Take 1 tablet by mouth daily.     promethazine (PHENERGAN) 12.5 MG tablet Take 6.25 mg by mouth 2 (two) times daily as needed (for vertigo or nausea).     traMADol (ULTRAM) 50 MG tablet Take 1 tablet (50 mg total) by mouth every 6 (six) hours as needed for pain.  (Patient not taking: Reported on 02/12/2015) 15 tablet 0   venlafaxine XR (EFFEXOR-XR) 75 MG 24 hr capsule Take 75 mg by mouth daily.  11   No current facility-administered medications for this visit.    REVIEW OF SYSTEMS      Objective:  Objective   There were no vitals filed for this visit. There is no height or weight on file to calculate BMI.  Physical Exam General: no acute distress Cardiac: hemodynamically stable, nontachycardic Pulm: normal work of breathing GI: non-tender, no pulsatile mass  Neuro: alert, no focal deficit Extremities: *** Vascular: ***   Data: ABI independently reviewed: Right 1.1 with a toe pressure 124 Left 1.19 with a toe pressure 114 Normal triphasic waveforms bilaterally  Bilateral lower extremity duplex independently reviewed: Normal triphasic waveforms throughout bilateral arterial vasculature, without any flow-limiting stenosis.     Assessment/Plan:     [name] is a 67 y.o. female ***     Daria Pastures MD [facility]

## 2023-03-02 ENCOUNTER — Encounter: Payer: PPO | Admitting: Vascular Surgery

## 2023-03-08 ENCOUNTER — Institutional Professional Consult (permissible substitution) (HOSPITAL_BASED_OUTPATIENT_CLINIC_OR_DEPARTMENT_OTHER): Payer: PPO | Admitting: Pulmonary Disease

## 2023-03-11 ENCOUNTER — Encounter: Payer: PPO | Admitting: Vascular Surgery
